# Patient Record
Sex: Male | Born: 1965
Health system: Southern US, Community
[De-identification: ages and names within clinical notes are randomized; demographics above are authoritative.]

## PROBLEM LIST (undated history)

## (undated) DIAGNOSIS — E538 Deficiency of other specified B group vitamins: Secondary | ICD-10-CM

## (undated) DIAGNOSIS — N529 Male erectile dysfunction, unspecified: Secondary | ICD-10-CM

## (undated) DIAGNOSIS — E785 Hyperlipidemia, unspecified: Secondary | ICD-10-CM

## (undated) DIAGNOSIS — F430 Acute stress reaction: Secondary | ICD-10-CM

## (undated) DIAGNOSIS — R9431 Abnormal electrocardiogram [ECG] [EKG]: Secondary | ICD-10-CM

## (undated) DIAGNOSIS — L309 Dermatitis, unspecified: Secondary | ICD-10-CM

## (undated) DIAGNOSIS — E038 Other specified hypothyroidism: Secondary | ICD-10-CM

## (undated) DIAGNOSIS — M25559 Pain in unspecified hip: Secondary | ICD-10-CM

## (undated) DIAGNOSIS — R7989 Other specified abnormal findings of blood chemistry: Secondary | ICD-10-CM

## (undated) HISTORY — DX: Acute stress reaction: F43.0

## (undated) HISTORY — DX: Other specified hypothyroidism: E03.8

## (undated) HISTORY — DX: Abnormal electrocardiogram (ECG) (EKG): R94.31

## (undated) HISTORY — DX: Deficiency of other specified B group vitamins: E53.8

## (undated) HISTORY — DX: Pain in unspecified hip: M25.559

## (undated) HISTORY — DX: Male erectile dysfunction, unspecified: N52.9

## (undated) HISTORY — DX: Hyperlipidemia, unspecified: E78.5

## (undated) HISTORY — DX: Other specified abnormal findings of blood chemistry: R79.89

## (undated) HISTORY — DX: Dermatitis, unspecified: L30.9

---

## 1991-09-11 HISTORY — PX: LASIK: SHX215

## 1996-09-10 HISTORY — PX: HAND SURGERY: SHX662

## 1998-06-15 ENCOUNTER — Encounter: Admission: RE | Admit: 1998-06-15 | Discharge: 1998-09-13 | Payer: Self-pay

## 1998-08-02 ENCOUNTER — Ambulatory Visit (HOSPITAL_COMMUNITY): Admission: RE | Admit: 1998-08-02 | Discharge: 1998-08-02 | Payer: Self-pay | Admitting: Orthopedic Surgery

## 1998-08-02 ENCOUNTER — Encounter: Payer: Self-pay | Admitting: Orthopedic Surgery

## 2002-08-27 ENCOUNTER — Encounter: Payer: Self-pay | Admitting: Family Medicine

## 2002-08-27 ENCOUNTER — Encounter: Admission: RE | Admit: 2002-08-27 | Discharge: 2002-08-27 | Payer: Self-pay | Admitting: Family Medicine

## 2005-03-02 ENCOUNTER — Ambulatory Visit: Payer: Self-pay | Admitting: Family Medicine

## 2005-05-09 ENCOUNTER — Ambulatory Visit: Payer: Self-pay | Admitting: Family Medicine

## 2006-10-11 HISTORY — PX: VASECTOMY: SHX75

## 2006-10-29 ENCOUNTER — Ambulatory Visit: Payer: Self-pay | Admitting: Family Medicine

## 2006-10-29 LAB — CONVERTED CEMR LAB
ALT: 18 units/L (ref 0–40)
AST: 21 units/L (ref 0–37)
Albumin: 3.9 g/dL (ref 3.5–5.2)
Alkaline Phosphatase: 61 units/L (ref 39–117)
BUN: 11 mg/dL (ref 6–23)
Basophils Absolute: 0 10*3/uL (ref 0.0–0.1)
Basophils Relative: 0.1 % (ref 0.0–1.0)
Bilirubin, Direct: 0.1 mg/dL (ref 0.0–0.3)
CO2: 30 meq/L (ref 19–32)
Calcium: 9.3 mg/dL (ref 8.4–10.5)
Chloride: 102 meq/L (ref 96–112)
Cholesterol: 139 mg/dL (ref 0–200)
Creatinine, Ser: 1 mg/dL (ref 0.4–1.5)
Eosinophils Absolute: 0 10*3/uL (ref 0.0–0.6)
Eosinophils Relative: 0.7 % (ref 0.0–5.0)
GFR calc Af Amer: 108 mL/min
GFR calc non Af Amer: 89 mL/min
Glucose, Bld: 96 mg/dL (ref 70–99)
HCT: 42.9 % (ref 39.0–52.0)
HDL: 40.7 mg/dL (ref >39.0)
Hemoglobin: 14.7 g/dL (ref 13.0–17.0)
LDL Cholesterol: 84 mg/dL (ref 0–99)
Lymphocytes Relative: 29.9 % (ref 12.0–46.0)
MCHC: 34.3 g/dL (ref 30.0–36.0)
MCV: 91.6 fL (ref 78.0–100.0)
Monocytes Absolute: 0.4 10*3/uL (ref 0.2–0.7)
Monocytes Relative: 9.2 % (ref 3.0–11.0)
Neutro Abs: 2.8 10*3/uL (ref 1.4–7.7)
Neutrophils Relative %: 60.1 % (ref 43.0–77.0)
Platelets: 226 10*3/uL (ref 150–400)
Potassium: 3.9 meq/L (ref 3.5–5.1)
RBC: 4.68 M/uL (ref 4.22–5.81)
RDW: 12 % (ref 11.5–14.6)
Sodium: 139 meq/L (ref 135–145)
TSH: 6.03 microintl units/mL — ABNORMAL HIGH (ref 0.35–5.50)
Total Bilirubin: 0.7 mg/dL (ref 0.3–1.2)
Total CHOL/HDL Ratio: 3.4
Total Protein: 6.5 g/dL (ref 6.0–8.3)
Triglycerides: 73 mg/dL (ref 0–149)
VLDL: 15 mg/dL (ref 0–40)
WBC: 4.6 10*3/uL (ref 4.5–10.5)

## 2006-11-27 ENCOUNTER — Ambulatory Visit: Payer: Self-pay | Admitting: Family Medicine

## 2006-11-27 LAB — CONVERTED CEMR LAB: TSH: 5.71 microintl units/mL — ABNORMAL HIGH (ref 0.35–5.50)

## 2007-03-10 ENCOUNTER — Ambulatory Visit: Payer: Self-pay | Admitting: Family Medicine

## 2007-03-10 DIAGNOSIS — E039 Hypothyroidism, unspecified: Secondary | ICD-10-CM | POA: Insufficient documentation

## 2007-03-11 LAB — CONVERTED CEMR LAB
Free T4: 0.9 ng/dL (ref 0.6–1.6)
TSH: 5.17 microintl units/mL (ref 0.35–5.50)

## 2008-03-10 ENCOUNTER — Ambulatory Visit: Payer: Self-pay | Admitting: Family Medicine

## 2008-03-10 ENCOUNTER — Encounter (INDEPENDENT_AMBULATORY_CARE_PROVIDER_SITE_OTHER): Payer: Self-pay | Admitting: Internal Medicine

## 2008-03-10 LAB — CONVERTED CEMR LAB
Chloride: 106 meq/L (ref 96–112)
GFR calc Af Amer: 95 mL/min
GFR calc non Af Amer: 79 mL/min
Potassium: 4.2 meq/L (ref 3.5–5.1)
T4, Total: 8.1 ug/dL (ref 5.0–12.5)
TSH: 4.06 microintl units/mL (ref 0.35–5.50)

## 2008-03-18 ENCOUNTER — Encounter: Admission: RE | Admit: 2008-03-18 | Discharge: 2008-03-18 | Payer: Self-pay | Admitting: Family Medicine

## 2008-08-06 ENCOUNTER — Ambulatory Visit: Payer: Self-pay | Admitting: Family Medicine

## 2008-08-06 LAB — CONVERTED CEMR LAB: Rapid Strep: NEGATIVE

## 2009-03-03 ENCOUNTER — Telehealth (INDEPENDENT_AMBULATORY_CARE_PROVIDER_SITE_OTHER): Payer: Self-pay | Admitting: Internal Medicine

## 2009-05-10 ENCOUNTER — Ambulatory Visit: Payer: Self-pay | Admitting: Family Medicine

## 2009-05-10 DIAGNOSIS — E079 Disorder of thyroid, unspecified: Secondary | ICD-10-CM | POA: Insufficient documentation

## 2009-05-12 LAB — CONVERTED CEMR LAB
ALT: 38 units/L (ref 0–53)
AST: 34 units/L (ref 0–37)
Albumin: 4.2 g/dL (ref 3.5–5.2)
Alkaline Phosphatase: 60 units/L (ref 39–117)
Basophils Absolute: 0 10*3/uL (ref 0.0–0.1)
Basophils Relative: 0 % (ref 0.0–3.0)
CO2: 30 meq/L (ref 19–32)
Calcium: 9.2 mg/dL (ref 8.4–10.5)
GFR calc non Af Amer: 87.34 mL/min (ref >60)
Glucose, Bld: 100 mg/dL — ABNORMAL HIGH (ref 70–99)
HCT: 40 % (ref 39.0–52.0)
Hemoglobin: 13.4 g/dL (ref 13.0–17.0)
Lymphocytes Relative: 18.5 % (ref 12.0–46.0)
Lymphs Abs: 1.1 10*3/uL (ref 0.7–4.0)
Monocytes Relative: 5.7 % (ref 3.0–12.0)
Neutro Abs: 4.4 10*3/uL (ref 1.4–7.7)
Potassium: 4.1 meq/L (ref 3.5–5.1)
RBC: 4.31 M/uL (ref 4.22–5.81)
RDW: 12 % (ref 11.5–14.6)
Sodium: 141 meq/L (ref 135–145)
TSH: 3.86 microintl units/mL (ref 0.35–5.50)
Total CHOL/HDL Ratio: 3
Total Protein: 6.6 g/dL (ref 6.0–8.3)
VLDL: 7 mg/dL (ref 0.0–40.0)

## 2009-09-13 ENCOUNTER — Ambulatory Visit: Payer: Self-pay | Admitting: Family Medicine

## 2009-10-11 HISTORY — PX: TOTAL HIP ARTHROPLASTY: SHX124

## 2009-10-20 ENCOUNTER — Encounter: Payer: Self-pay | Admitting: Family Medicine

## 2009-11-08 ENCOUNTER — Encounter: Payer: Self-pay | Admitting: Family Medicine

## 2009-11-11 ENCOUNTER — Encounter: Payer: Self-pay | Admitting: Family Medicine

## 2009-11-12 ENCOUNTER — Encounter: Payer: Self-pay | Admitting: Family Medicine

## 2009-11-12 ENCOUNTER — Observation Stay (HOSPITAL_COMMUNITY): Admission: EM | Admit: 2009-11-12 | Discharge: 2009-11-13 | Payer: Self-pay | Admitting: Emergency Medicine

## 2009-11-13 ENCOUNTER — Encounter: Payer: Self-pay | Admitting: Family Medicine

## 2009-11-16 ENCOUNTER — Ambulatory Visit: Payer: Self-pay | Admitting: Family Medicine

## 2009-11-16 DIAGNOSIS — R7401 Elevation of levels of liver transaminase levels: Secondary | ICD-10-CM | POA: Insufficient documentation

## 2009-11-16 DIAGNOSIS — E538 Deficiency of other specified B group vitamins: Secondary | ICD-10-CM

## 2009-11-16 DIAGNOSIS — D509 Iron deficiency anemia, unspecified: Secondary | ICD-10-CM

## 2009-11-16 DIAGNOSIS — R740 Nonspecific elevation of levels of transaminase and lactic acid dehydrogenase [LDH]: Secondary | ICD-10-CM

## 2009-11-17 ENCOUNTER — Telehealth: Payer: Self-pay | Admitting: Family Medicine

## 2009-11-17 ENCOUNTER — Ambulatory Visit: Payer: Self-pay | Admitting: Family Medicine

## 2009-11-17 LAB — CONVERTED CEMR LAB
AST: 89 units/L — ABNORMAL HIGH (ref 0–37)
BUN: 9 mg/dL (ref 6–23)
Basophils Absolute: 0 10*3/uL (ref 0.0–0.1)
Basophils Relative: 0.1 % (ref 0.0–3.0)
Bilirubin, Direct: 0.2 mg/dL (ref 0.0–0.3)
Chloride: 103 meq/L (ref 96–112)
Eosinophils Absolute: 0 10*3/uL (ref 0.0–0.7)
GFR calc non Af Amer: 131.48 mL/min (ref >60)
Lymphocytes Relative: 10.9 % — ABNORMAL LOW (ref 12.0–46.0)
MCHC: 33.4 g/dL (ref 30.0–36.0)
MCV: 91.8 fL (ref 78.0–100.0)
Monocytes Absolute: 1 10*3/uL (ref 0.1–1.0)
Neutrophils Relative %: 77.4 % — ABNORMAL HIGH (ref 43.0–77.0)
Platelets: 65 10*3/uL — ABNORMAL LOW (ref 150.0–400.0)
Potassium: 4.3 meq/L (ref 3.5–5.1)
RDW: 11.7 % (ref 11.5–14.6)
Sodium: 139 meq/L (ref 135–145)
Total Bilirubin: 0.5 mg/dL (ref 0.3–1.2)

## 2009-11-18 ENCOUNTER — Encounter: Payer: Self-pay | Admitting: Family Medicine

## 2009-11-18 ENCOUNTER — Emergency Department (HOSPITAL_COMMUNITY): Admission: EM | Admit: 2009-11-18 | Discharge: 2009-11-18 | Payer: Self-pay | Admitting: Emergency Medicine

## 2009-11-18 LAB — CONVERTED CEMR LAB
ALT: 161 units/L — ABNORMAL HIGH (ref 0–53)
AST: 113 units/L — ABNORMAL HIGH (ref 0–37)
Basophils Relative: 0.4 % (ref 0.0–3.0)
HCT: 31.8 % — ABNORMAL LOW (ref 39.0–52.0)
Hemoglobin: 10.6 g/dL — ABNORMAL LOW (ref 13.0–17.0)
Lymphocytes Relative: 5.5 % — ABNORMAL LOW (ref 12.0–46.0)
Lymphs Abs: 0.7 10*3/uL (ref 0.7–4.0)
MCHC: 33.2 g/dL (ref 30.0–36.0)
Monocytes Relative: 7.6 % (ref 3.0–12.0)
Neutro Abs: 11.1 10*3/uL — ABNORMAL HIGH (ref 1.4–7.7)
RBC: 3.43 M/uL — ABNORMAL LOW (ref 4.22–5.81)

## 2009-11-24 ENCOUNTER — Telehealth: Payer: Self-pay | Admitting: Family Medicine

## 2009-11-28 ENCOUNTER — Telehealth: Payer: Self-pay | Admitting: Family Medicine

## 2009-12-13 ENCOUNTER — Ambulatory Visit: Payer: Self-pay | Admitting: Family Medicine

## 2009-12-13 DIAGNOSIS — M79609 Pain in unspecified limb: Secondary | ICD-10-CM | POA: Insufficient documentation

## 2009-12-15 LAB — CONVERTED CEMR LAB
Basophils Absolute: 0 10*3/uL (ref 0.0–0.1)
Eosinophils Absolute: 0.2 10*3/uL (ref 0.0–0.7)
HCT: 34.8 % — ABNORMAL LOW (ref 39.0–52.0)
Hemoglobin: 12.1 g/dL — ABNORMAL LOW (ref 13.0–17.0)
Lymphs Abs: 1.3 10*3/uL (ref 0.7–4.0)
MCHC: 34.7 g/dL (ref 30.0–36.0)
Monocytes Absolute: 0.4 10*3/uL (ref 0.1–1.0)
Neutro Abs: 2.7 10*3/uL (ref 1.4–7.7)
RDW: 14 % (ref 11.5–14.6)

## 2010-06-17 IMAGING — CT CT ABD-PELV W/ CM
2 of 5 series · 17 of 46 positions shown, 19 images · IV contrast (APPLIED)
Comparison: None

CLINICAL DATA: Abdominal pain, constipation, low back pain, nausea,
vomiting

CT ABDOMEN AND PELVIS WITH CONTRAST
TECHNIQUE: Multidetector CT imaging of the abdomen and pelvis was
performed following the standard protocol during bolus
administration of intravenous contrast. Breast shield utilized.
Sagittal and coronal MPR images reconstructed from axial data set.
Contrast: Dilute oral contrast and 125 ml 0mnipaque-B00 IV

[Series 2: abd_pel 5.0 b40f st · axial · 0.69mm/px · z∈[-426,-41]mm · 14 of 87 slices shown, 16 images]
[im 5/87  soft-tissue]
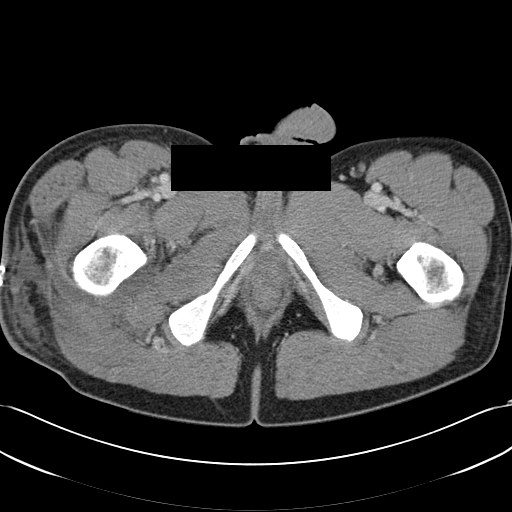
[im 5/87  bone]
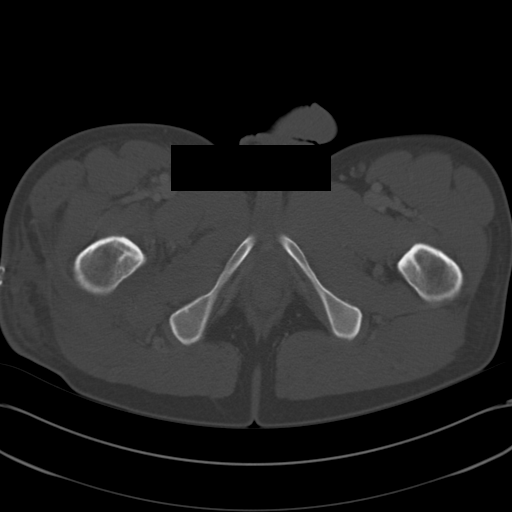
[im 10/87  soft-tissue]
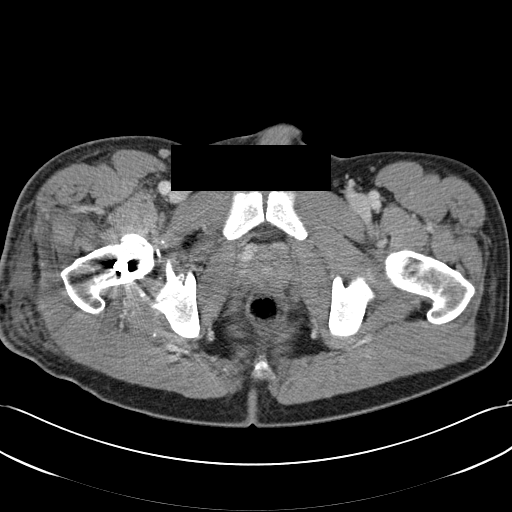
[im 19/87  soft-tissue]
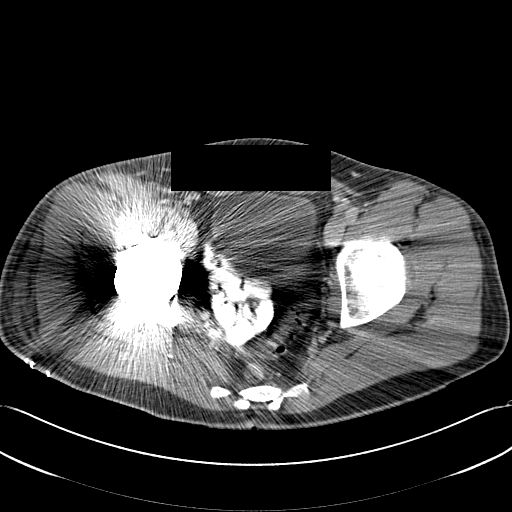
[im 23/87  soft-tissue]
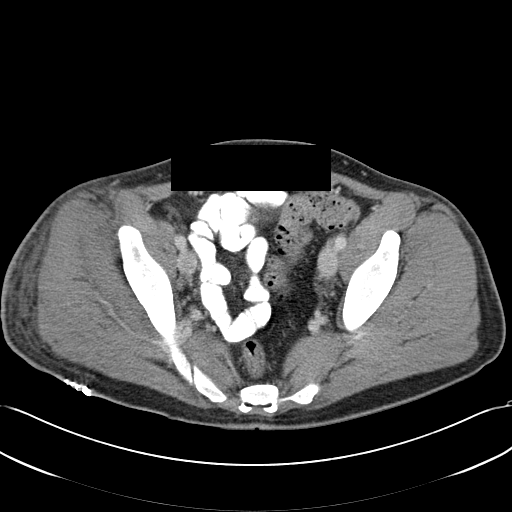
[im 28/87  soft-tissue]
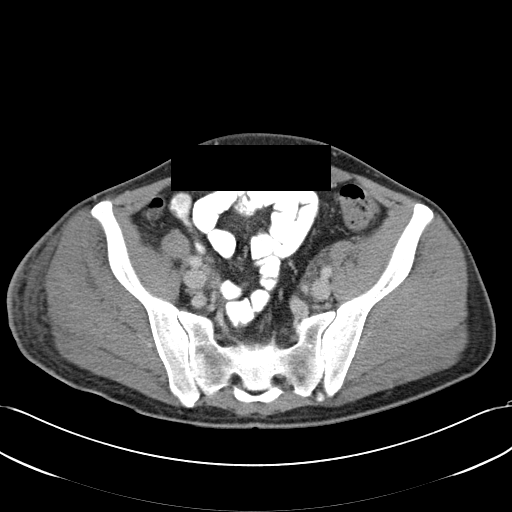
[im 37/87  soft-tissue]
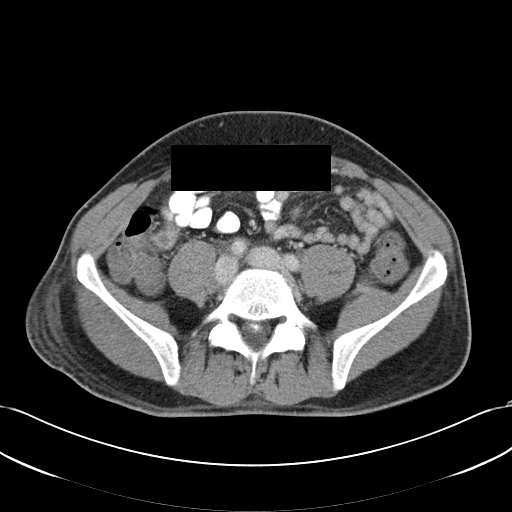
[im 41/87  soft-tissue]
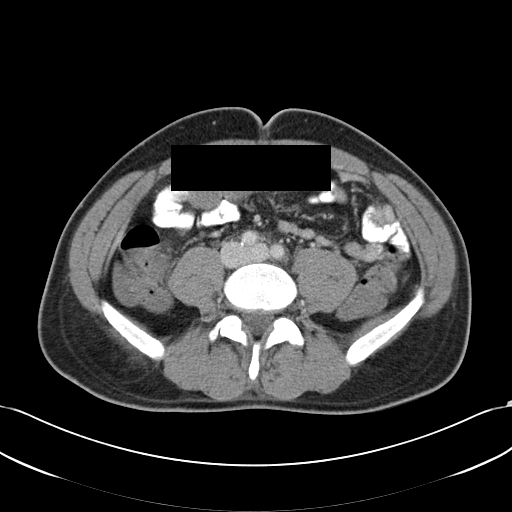
[im 46/87  soft-tissue]
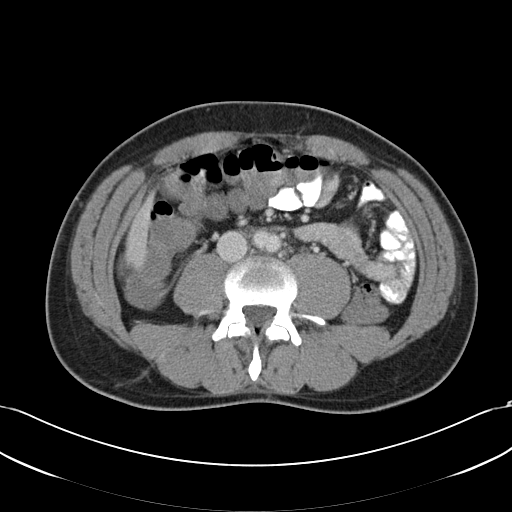
[im 50/87  soft-tissue]
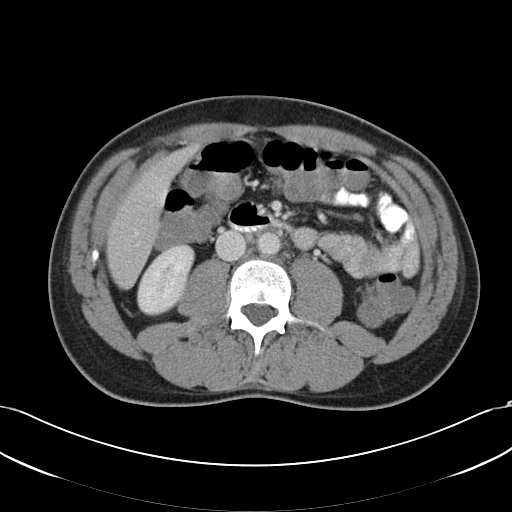
[im 50/87  bone]
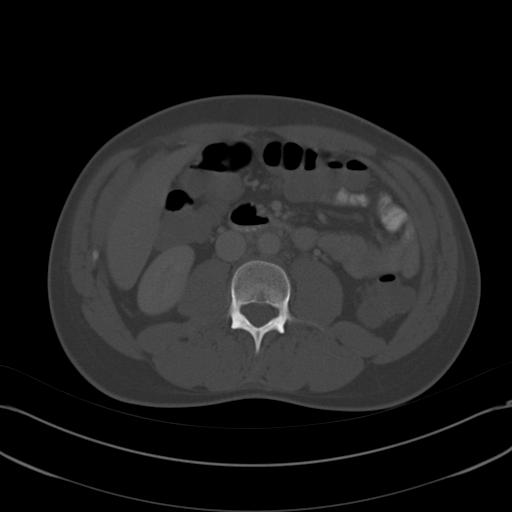
[im 59/87  soft-tissue]
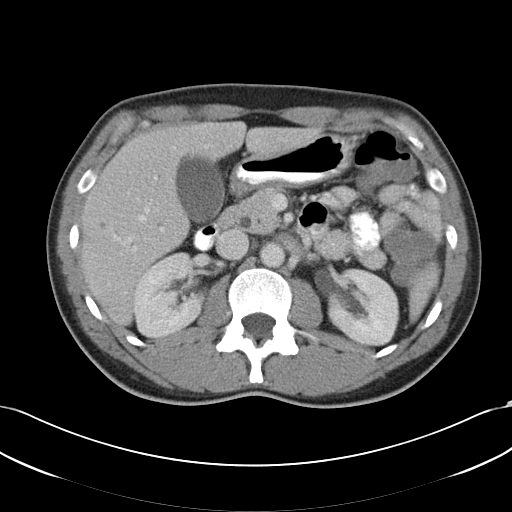
[im 64/87  soft-tissue]
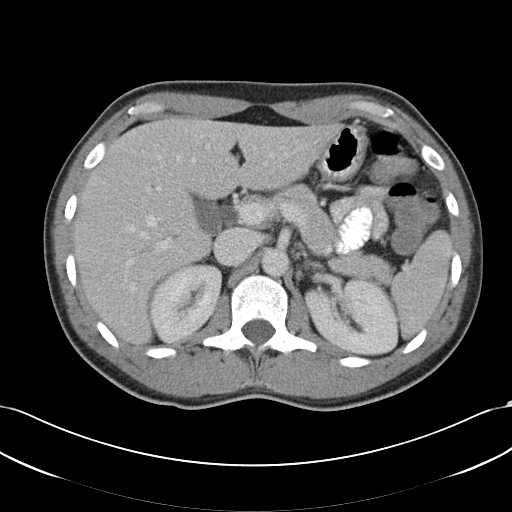
[im 68/87  soft-tissue]
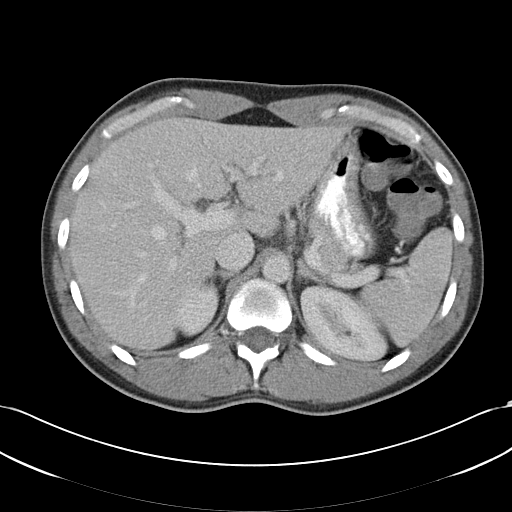
[im 77/87  soft-tissue]
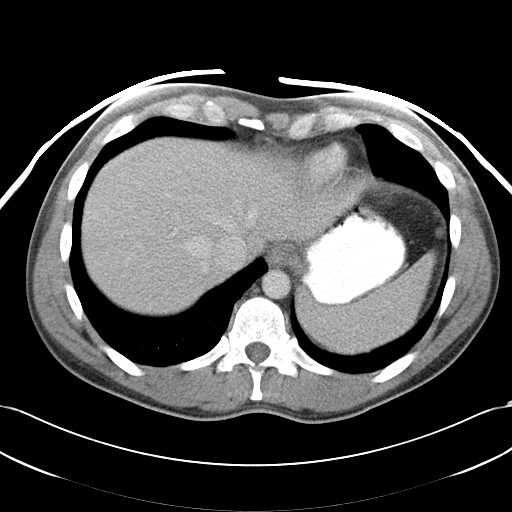
[im 82/87  soft-tissue]
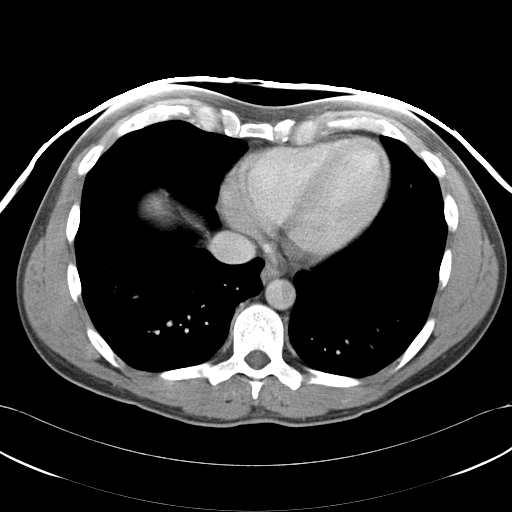

[Series 602: coronal abddomen · coronal · 0.88mm/px · 3 of 114 slices shown]
[im 38/114  soft-tissue]
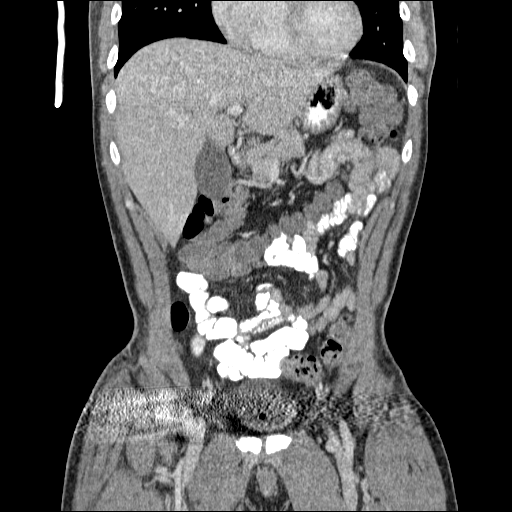
[im 51/114  soft-tissue]
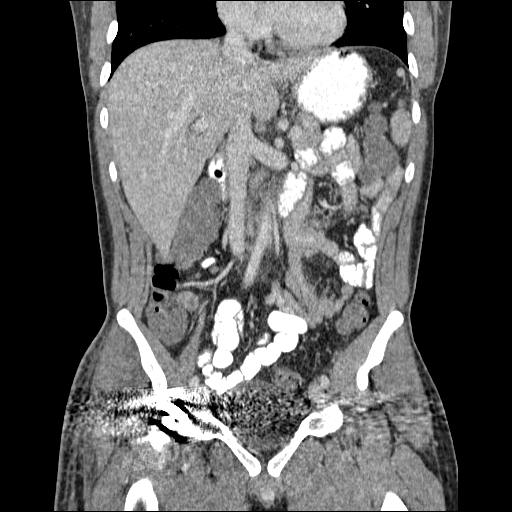
[im 63/114  soft-tissue]
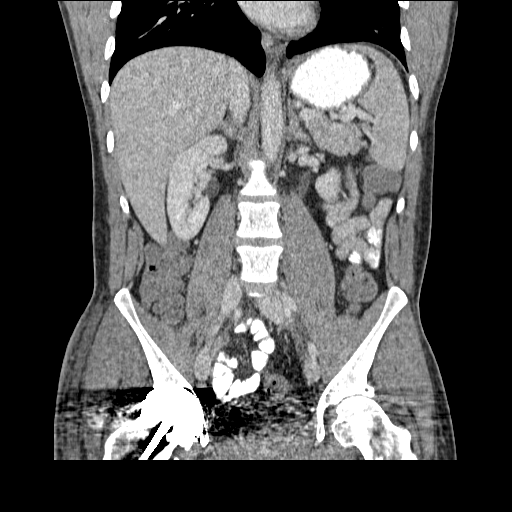

[17 of 46 positions shown; findings below may reference images not displayed]

FINDINGS: Minimal atelectasis left lung base.
Liver, spleen, pancreas, kidneys, and adrenal glands normal
appearance.
Stomach and bowel loops unremarkable.
Normal appendix.
Beam hardening artifacts from right hip prosthesis obscure portions
of pelvis.
Minimal hazy infiltrative changes are seen within periaortic fat,
nonspecific.
Scattered normal-sized lymph nodes.
No mass, adenopathy, ascites or definite acute inflammatory
process.
IMPRESSION: Minimal nonspecific para-aortic stranding, of questionable clinical
significance.
No other intra abdominal abnormalities are identified.

## 2010-07-28 ENCOUNTER — Ambulatory Visit: Payer: Self-pay | Admitting: Family Medicine

## 2010-07-28 DIAGNOSIS — J069 Acute upper respiratory infection, unspecified: Secondary | ICD-10-CM | POA: Insufficient documentation

## 2010-08-08 ENCOUNTER — Ambulatory Visit: Payer: Self-pay | Admitting: Family Medicine

## 2010-10-12 NOTE — Letter (Signed)
Summary: Meds Appts & Instructions/[facility]  Meds Appts & Instructions/[facility]   Imported By: Lanelle Bal 11/16/2009 12:35:21  _____________________________________________________________________  External Attachment:    Type:   Image     Comment:   External Document

## 2010-10-12 NOTE — Assessment & Plan Note (Signed)
Summary: 2 week follow up - recheck labs /lsf   Vital Signs:  Patient profile:   45 year old male Height:      67 inches Weight:      146.25 pounds BMI:     22.99 Temp:     98 degrees F rectal Pulse rate:   80 / minute Pulse rhythm:   regular BP sitting:   110 / 58  (left arm) Cuff size:   regular  Vitals Entered By: Lewanda Rife LPN (December 13, 452 9:12 AM) CC: 2 week f/u and recheck labs   History of Present Illness: here for f/u of anemia after hip repl   is 100 % better   last visit - malaise and then was sent to hosp with low platelet- that did correct itself   diarrhea -- is better / nl c diff - that is better   last cbc platelet 68, HB in 10s and elevated wbc  thinks the low platelet was from lovenox -- corrected after changing from    on nu iron 150 once daily -- thinks that has boosted it back up   hip is stiff and sore - walking without a cane   spot on bottom of L foot is sore and a knot- now is much better  still wearing ted hose   Allergies (verified): No Known Drug Allergies  Past History:  Past Medical History: Last updated: 06-05-09 increased tsh  stress reaction  hip pain   Past Surgical History: Last updated: 11/16/2009 vesectomy  2/08 plate nd screws in 3rd finger of R hand follow ing fx--19998 ALK--ou 1993 hip replacement 2/11   Family History: Last updated: 06/05/09 father HTN , and died of MI 49  mother - hypothyroid  DM in grandparent PGM  MGF with DM   Social History: Last updated: June 05, 2009 Marital Status: Married Children: 2 Occupation: Nurse, adult for [employer] Never Smoked 1-2 drinks per month maximum  Risk Factors: Alcohol Use: <1 (03/10/2008) Caffeine Use: 3 (03/10/2008) Exercise: yes (03/10/2008)  Risk Factors: Smoking Status: never (08/06/2008) Passive Smoke Exposure: no (03/10/2008)  Review of Systems General:  Denies fatigue, fever, loss of appetite, and malaise. Eyes:  Denies blurring and eye  irritation. CV:  Denies chest pain or discomfort, lightheadness, and palpitations. Resp:  Denies cough and wheezing. GI:  Denies abdominal pain, bloody stools, change in bowel habits, indigestion, nausea, and vomiting; wants to go ahead and stop omeprazole since stomach feels fine. GU:  Denies hematuria. MS:  Complains of joint pain and stiffness; denies joint redness and joint swelling. Derm:  Denies lesion(s), poor wound healing, and rash. Neuro:  Denies numbness, tingling, and weakness. Psych:  mood is good . Heme:  Denies abnormal bruising, bleeding, and enlarge lymph nodes.  Physical Exam  General:  much improved - energetic with good color  Head:  normocephalic, atraumatic, and no abnormalities observed.   Eyes:  vision grossly intact, pupils equal, pupils round, and pupils reactive to light.  no conj pallor  Mouth:  pharynx pink and moist.   Neck:  supple with full rom and no masses or thyromegally, no JVD or carotid bruit  Lungs:  Normal respiratory effort, chest expands symmetrically. Lungs are clear to auscultation, no crackles or wheezes. Heart:  Normal rate and regular rhythm. S1 and S2 normal without gallop, murmur, click, rub or other extra sounds. Abdomen:  Bowel sounds positive,abdomen soft and non-tender without masses, organomegaly or hernias noted. Msk:  1/2 cm knot on bottom  of L foot in arch- rubbery and slt tender wearing ted hose improved rom of hip Pulses:  R and L carotid,radial,femoral,dorsalis pedis and posterior tibial pulses are full and equal bilaterally Extremities:  no CCE Neurologic:  sensation intact to light touch, gait normal, and DTRs symmetrical and normal.   Skin:  no ecchymosis  no pallor or jaundice  Psych:  normal affect, talkative and pleasant    Impression & Recommendations:  Problem # 1:  ANEMIA, IRON DEFICIENCY (ICD-280.9) Assessment Unchanged  after hip surgery on iron  re check today and adv  also check on platelet and wbc -  suspect will be normalized  His updated medication list for this problem includes:    Vitamin B-12 1000 Mcg Tabs (Cyanocobalamin) .Marland Kitchen... Take one tablet daily    Nu-iron 150 Mg Caps (Polysaccharide iron complex) .Marland Kitchen... 1 by mouth once daily with food  Orders: Venipuncture ([address]) TLB-CBC Platelet - w/Differential (85025-CBCD)  Hgb: 10.6 (11/17/2009)   Hct: 31.8 (11/17/2009)   Platelets: 68.0 (11/17/2009) RBC: 3.43 (11/17/2009)   RDW: 11.8 (11/17/2009)   WBC: 12.9 (11/17/2009) MCV: 92.6 (11/17/2009)   MCHC: 33.2 (11/17/2009) TSH: 3.86 (05/10/2009)  Problem # 2:  FOOT PAIN, LEFT (ICD-729.5) Assessment: New with knot on bottom of foot -- overall now improved ? ca dep on tendon  pt will f/u with ortho for this - wear supportive shoe  Complete Medication List: 1)  Miralax Pack (Polyethylene glycol 3350) .Marland KitchenMarland Kitchen. 17 gms by mouth daily as needed. 2)  Tylenol Extra Strength 500 Mg Tabs (Acetaminophen) .... Otc as directed. 3)  Vitamin C 500 Mg Tabs (Ascorbic acid) .... One daily 4)  Vitamin B-12 1000 Mcg Tabs (Cyanocobalamin) .... Take one tablet daily 5)  Nu-iron 150 Mg Caps (Polysaccharide iron complex) .Marland Kitchen.. 1 by mouth once daily with food 6)  Aspirin 325 Mg Tabs (Aspirin) .... Take 1 tablet by mouth once a day  Patient Instructions: 1)  labs today 2)  continue your iron until I tell you to stop  3)  if spot on foot grows or hurts more - let me know , and show to your orthopedist at follow up   Current Allergies (reviewed today): No known allergies

## 2010-10-12 NOTE — Assessment & Plan Note (Signed)
Summary: FEVER AND COUGH/EVM   Vital Signs:  Patient Profile:   45 Years Old Male CC:      Cold & URI symptoms Height:     67.5 inches (170.18 cm) Weight:      159 pounds BMI:     24.62 O2 Sat:      97 % O2 treatment:    Room Air Temp:     97.8 degrees F oral Pulse rate:   82 / minute Pulse rhythm:   regular Resp:     18 per minute BP sitting:   129 / 65  (right arm)  Pt. in pain?   yes    Location:   chest    Intensity:   2    Type:       aching  Vitals Entered By: Levonne Spiller EMT-P (July 28, 2010 2:48 PM)              Is Patient Diabetic? No      Current Allergies: No known allergies History of Present Illness Chief Complaint: Cold & URI symptoms  REVIEW OF SYSTEMS Constitutional Symptoms       Complains of fever and chills.     Denies night sweats, weight loss, weight gain, and fatigue.  Eyes       Denies change in vision, eye pain, eye discharge, glasses, contact lenses, and eye surgery. Ear/Nose/Throat/Mouth       Complains of sinus problems and hoarseness.      Denies hearing loss/aids, change in hearing, ear pain, ear discharge, dizziness, frequent runny nose, frequent nose bleeds, sore throat, and tooth pain or bleeding.  Respiratory       Complains of productive cough.      Denies dry cough, wheezing, shortness of breath, asthma, bronchitis, and emphysema/COPD.      Comments: Light yellow colored sputum Cardiovascular       Denies murmurs, chest pain, and tires easily with exhertion.    Gastrointestinal       Denies stomach pain, nausea/vomiting, diarrhea, constipation, blood in bowel movements, and indigestion. Genitourniary       Denies painful urination, kidney stones, and loss of urinary control. Neurological       Denies paralysis, seizures, and fainting/blackouts. Musculoskeletal       Denies muscle pain, joint pain, joint stiffness, decreased range of motion, redness, swelling, muscle weakness, and gout.  Skin       Denies bruising,  unusual mles/lumps or sores, and hair/skin or nail changes.  Psych       Denies mood changes, temper/anger issues, anxiety/stress, speech problems, depression, and sleep problems.  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 1-2 drinks per month maximum  Risk Factors: Alcohol Use: <1 (03/10/2008) Caffeine Use: 3 (03/10/2008) Exercise: yes (03/10/2008)  Risk Factors: Smoking Status: never (08/06/2008) Passive Smoke Exposure: no (03/10/2008)  Past Surgical History: vesectomy  2/08 plate nd screws in 3rd finger of R hand follow ing fx--19998 ALK--ou 1993 hip replacement (Resurfacing)  2/11 Physical Exam General appearance: well developed, well nourished, no acute distress Head: normocephalic, atraumatic Eyes: conjunctivae and lids normal Pupils: equal, round, reactive to light Ears: normal, no lesions or  deformities Nasal: nasal congestion and edema Oral/Pharynx: dry mucous membranes Neck: neck supple,  trachea midline, no masses Chest/Lungs: no rales, wheezes, or rhonchi bilateral, breath sounds equal without effort Heart: regular rate and  rhythm, no murmur Abdomen: soft, non-tender without obvious organomegaly Extremities: normal extremities Neurological: grossly intact and non-focal Skin: no obvious rashes or lesions MSE: oriented to time, place, and person Assessment New Problems: COUGH (ICD-786.2) UPPER RESPIRATORY INFECTION, ACUTE (ICD-465.9)   Patient Education: Patient and/or caregiver instructed in the following: rest, fluids. The risks, benefits and possible side effects were clearly explained and discussed with the patient.  The  patient verbalized clear understanding.  The patient was given instructions to return if symptoms don't improve, worsen or new changes develop.  If it is not during clinic hours and the patient cannot get back to this clinic then the patient was told to seek medical care at an available urgent care or emergency department.  The patient verbalized understanding.   Demonstrates willingness to comply.  Plan New Medications/Changes: ZITHROMAX Z-PAK 250 MG TABS (AZITHROMYCIN) take as directed  #1 x 0, 07/28/2010, Clanford Johnson MD MYTUSSIN AC 100-10 MG/5ML SYRP (GUAIFENESIN-CODEINE) take 1 teaspoon by mouth every 4-6 hours as needed cough and congestion  #120 mL x 0, 07/28/2010, Clanford Johnson MD  Follow Up: Follow up on an as needed basis, Follow up with Primary Physician  The patient and/or caregiver has been counseled thoroughly with regard to medications prescribed including dosage, schedule, interactions, rationale for use, and possible side effects and they verbalize understanding.  Diagnoses and expected course of recovery discussed and will return if not improved as expected or if the condition worsens. Patient and/or caregiver verbalized understanding.  Prescriptions: ZITHROMAX Z-PAK 250 MG TABS (AZITHROMYCIN) take as directed  #1 x 0   Entered and Authorized by:   Standley Dakins MD   Signed by:   Standley Dakins MD on 07/28/2010   Method used:   Electronically to        [facility] (retail)       [address], [address]       [place], [place]  [address]       Ph: [phone]       Fax: [phone]   RxID:   [id number] MYTUSSIN AC 100-10 MG/5ML SYRP (GUAIFENESIN-CODEINE) take 1 teaspoon by mouth every 4-6 hours as needed cough and congestion  #120 mL x 0   Entered and Authorized by:   Standley Dakins MD   Signed by:   Standley Dakins MD on 07/28/2010   Method used:   Print then Give to Patient   RxID:    [id number]   Patient Instructions: 1)  The patient was informed that there is no on-call Jaylani Mcguinn or services available at this clinic during off-hours (when the clinic is closed).  If the patient developed a problem or concern that required immediate attention, the patient was advised to go the the nearest available urgent care or emergency department for medical care.  The patient verbalized understanding.    2)  Get plenty of rest, drink lots of clear liquids, and use Tylenol or Ibuprofen for fever and comfort. Return in 7-10 days if you're not better:sooner if you're feeling worse. 3)  I don't think that you need to take antibiotics at this time.  If you develop worsening productive cough, sinus pressure, drainage, fever, chills, or bad taste in mouth get the prescription for  the antibiotic Zithromax filled and take it as directed.

## 2010-10-12 NOTE — Progress Notes (Signed)
Summary: regarding prilosec  Phone Note Call from Patient Call back at Home Phone [phone]   Caller: Patient Call For: Judith Part MD Summary of Call: Pt was told to take 2 prilosec  a day at last office visit.  He is asking how long he is to continue this.  I told him to continue until you advise, he knows you are out till tuesday. Initial call taken by: Lowella Petties CMA,  November 24, 2009 10:09 AM  Follow-up for Phone Call        can switch now to once daily in am (if stomach is feeling better)-- continue that for 1 mo and then stop- but at any time update me if abd pain returns Follow-up by: Judith Part MD,  November 24, 2009 1:37 PM  Additional Follow-up for Phone Call Additional follow up Details #1::        Patient notified as instructed by telephone. Lewanda Rife LPN  November 24, 2009 2:17 PM

## 2010-10-12 NOTE — Letter (Signed)
Summary: *Referral Letter [facility])  Albert Lea at [facility]  [address] [place], [place] [address]   Phone: [phone]  Fax: [phone]    11/18/2009  Thank you in advance for agreeing to see my patient:  Warren Hatfield [address] [place], [place]  [address]  Phone: [phone]  Reason for Referral: Pt has fever, abdominal pain, low platelet, high wbc. Please have ER doctor evaluate. Pt was recently hospitalized.  Procedures Requested:   Current Medical Problems: 1)  TRANSAMINASES, SERUM, ELEVATED (ICD-790.4) 2)  VITAMIN B12 DEFICIENCY (ICD-266.2) 3)  ANEMIA, IRON DEFICIENCY (ICD-280.9) 4)  DIARRHEA (ICD-787.91) 5)  EPIGASTRIC PAIN (ICD-789.06) 6)  THYROID STIMULATING HORMONE, ABNORMAL (ICD-246.9) 7)  ISCHEMIC HEART DISEASE, PREMATURE, FAMILY HX (ICD-V17.3) 8)  HEALTH MAINTENANCE EXAM (ICD-V70.0) 9)  HYPOTHYROIDISM NOS (ICD-244.9)   Current Medications: 1)  TESSALON 200 MG CAPS (BENZONATATE) one tab by mouth three times a day as needed for cough 2)  MIRALAX  PACK (POLYETHYLENE GLYCOL 3350) 17 gms by mouth daily as needed. 3)  LOVENOX 40 MG/0.4ML SOLN (ENOXAPARIN SODIUM) 40mg  subcutaneously daily 4)  SENOKOT 8.6 MG TABS (SENNOSIDES) two tablets by mouth every am 5)  TYLENOL EXTRA STRENGTH 500 MG TABS (ACETAMINOPHEN) OTC As directed. 6)  * OXYCODONE/ACETAMINOPHEN ?MG One - three tablets by mouth every 4 hrs as needed. 7)  VITAMIN C 500 MG  TABS (ASCORBIC ACID) one daily 8)  VITAMIN B-12 1000 MCG TABS (CYANOCOBALAMIN) take one tablet daily 9)  PRILOSEC 20 MG CPDR (OMEPRAZOLE) 1 by mouth two times a day 10)  NU-IRON 150 MG CAPS (POLYSACCHARIDE IRON COMPLEX) 1 by mouth once daily with food   Past Medical History: 1)  increased tsh  2)  stress reaction  3)  hip pain    Family History: 1)  father HTN , and died of MI 90  2)  mother - hypothyroid  3)  DM in grandparent PGM  4)  MGF with DM     Social History: 1)  Marital Status: Married 2)   Children: 2 3)  Occupation: Nurse, adult for [employer] 4)  Never Smoked 5)  1-2 drinks per month maximum   Pertinent Labs:    Please send (mail / fax) all correspondence pertaining to this referral to:  ATTN:    _________________________________   [facility]   [address]   [place], [place] [address]    Fax: [phone]  Thank you again for agreeing to see our patient; please contact us if you have any further questions or need additional information.  Sincerely,  Idamae Schuller Tower,MD  Appended Document: *Referral Letter [facility]) faxed to [facility] Triage [phone] as instructed.

## 2010-10-12 NOTE — Letter (Signed)
Summary: [facility]  [facility]   Imported By: Beau Fanny 11/21/2009 15:31:15  _____________________________________________________________________  External Attachment:    Type:   Image     Comment:   External Document

## 2010-10-12 NOTE — Progress Notes (Signed)
Summary: wants to know if he needs to be seen   Phone Note Call from Patient Call back at Home Phone [phone]   Caller: Patient Call For: Judith Part MD Summary of Call: Patient wants to know if he needs to continue taking the iron or if he needs to be rechecked. He says that he was never told any instruction as to what to do and when to be seen again.  Initial call taken by: Melody Comas,  November 28, 2009 9:38 AM  Follow-up for Phone Call        continue iron as ordered to build up blood count  f/u with me in 2-4 weeks and will also check lab at that time Follow-up by: Judith Part MD,  November 28, 2009 11:12 PM  Additional Follow-up for Phone Call Additional follow up Details #1::        Patient Advised. Appointment scheduled  12/13/2009 Additional Follow-up by: Delilah Shan CMA (AAMA),  November 29, 2009 2:22 PM

## 2010-10-12 NOTE — Progress Notes (Signed)
Summary: Tingling in finger tips  Phone Note Call from Patient Call back at Home Phone [phone]   Caller: Mom/Sheila Call For: Judith Part MD Summary of Call: Patient's mom called stated that her son just left our office from having blood work.  Now he is complaining of tingling in his fingers and that the tips of his fingers are turning red.  The redness goes away after a few minutes and he was concerned.  Thinks it may be due to his platelets being low.  He is having no tingling in his arms or legs, no SOB, no wheezing.  He says that he cannot tell a change in anything else.  Please advise. Initial call taken by: Linde Gillis CMA Duncan Dull),  November 17, 2009 3:25 PM  Follow-up for Phone Call        Tingling should resolve and is typical of anxious moments like blood draws. If continues after today, call back for appt to be seen. Follow-up by: Shaune Leeks MD,  November 17, 2009 4:55 PM  Additional Follow-up for Phone Call Additional follow up Details #1::        Patient advised as instructed Additional Follow-up by: Linde Gillis CMA Duncan Dull),  November 17, 2009 5:02 PM

## 2010-10-12 NOTE — Letter (Signed)
Summary: [facility]  [facility]   Imported By: Lanelle Bal 11/23/2009 13:27:37  _____________________________________________________________________  External Attachment:    Type:   Image     Comment:   External Document

## 2010-10-12 NOTE — Progress Notes (Signed)
Summary: pt is a little better  Phone Note Call from Patient Call back at [facility] Phone [phone]   Caller: Patient Call For: Judith Part MD Summary of Call: Advised pt of lab results, he said he is feeling a little better than when he came in yesterday.  He is asking if he should be taking anything for his abd pain, besides prilosec- maybe something to coat his stomach, like MOM/ Initial call taken by: Lowella Petties CMA,  November 17, 2009 10:22 AM  Follow-up for Phone Call        he can do some maalox or mylanta and continue the prilosec- hopefully it will help more  I am so glad he is having some improvement I am re- assured by the hb coming up  I am a bit worried about low platelet count -- though this could be from his recent surgery I am cautious about the liver fxn -- suspect this is from tylenol , however  if his hip does not hurt too much- I do want him to take as little tylenol as possible at this point  I need to check the platelets again soon -- either late this afternoon or tom am --- and if he notices easy bruising or bleeding call asap  please sched labs - ast/alt and cbc with diff  Follow-up by: Judith Part MD,  November 17, 2009 10:31 AM  Additional Follow-up for Phone Call Additional follow up Details #1::        Patient notified as instructed by telephone. lab appointment scheduled as instructed  11/17/09 at 2:30pm.Rena [facility] LPN  November 17, 2009 11:07 AM

## 2010-10-12 NOTE — Assessment & Plan Note (Signed)
Summary: HOSPITAL F/U [place] NEEDS CBC/DLO   Vital Signs:  Patient profile:   45 year old male Height:      67 inches Weight:      149.25 pounds BMI:     23.46 Temp:     98.9 degrees F oral Pulse rate:   84 / minute Pulse rhythm:   regular BP sitting:   124 / 70  (left arm) Cuff size:   regular  Vitals Entered By: Lewanda Rife LPN (November 16, 452 12:36 PM) CC: f/u [facility] needs CBC   History of Present Illness: pt is here for hosp f/u had recent hip repl and then was hosp at [facility] for several other problems  had acute abd pain from constipation from narcotics as well as anemia from surg (need to re check) also dehydration  was found to have hb of 8.5 and also low B12 at 238  is on iron and B12 and needs re check  was tx with miralax for constip  is still feeling really bad -- but is able to have bm - which is now loose no longer taking any laxatives / etc   the hip is getting better   still having a lot of abd pain and pressure -- is all epigastric now - dull and persistant no heartburn no urinary symptoms  was on toradol in the hospital  no longer on mobic   is only taking tylenol now -- has not needed narcotics   no n/v  just pain in mid abd as if it was in a spasm   is still having some fever -- low grade -- every day since his surgery  CT and abd x ray were nl   goes back to hip surgeon on 16th  wound is looking good   Allergies (verified): No Known Drug Allergies  Past History:  Past Medical History: Last updated: 06-Jun-2009 increased tsh  stress reaction  hip pain   Family History: Last updated: 06-06-2009 father HTN , and died of MI 90  mother - hypothyroid  DM in grandparent PGM  MGF with DM   Social History: Last updated: June 06, 2009 Marital Status: Married Children: 2 Occupation: Nurse, adult for [employer] Never Smoked 1-2 drinks per month maximum  Risk Factors: Alcohol Use: <1 (03/10/2008) Caffeine Use: 3  (03/10/2008) Exercise: yes (03/10/2008)  Risk Factors: Smoking Status: never (08/06/2008) Passive Smoke Exposure: no (03/10/2008)  Past Surgical History: vesectomy  2/08 plate nd screws in 3rd finger of R hand follow ing fx--19998 ALK--ou 1993 hip replacement 2/11   Review of Systems General:  Complains of fatigue, fever, and loss of appetite; denies sweats. Eyes:  Denies blurring and eye irritation. ENT:  Denies nasal congestion, sinus pressure, and sore throat. CV:  Denies chest pain or discomfort, lightheadness, palpitations, and shortness of breath with exertion. Resp:  Denies cough, pleuritic, shortness of breath, and wheezing. GI:  Complains of abdominal pain, change in bowel habits, diarrhea, gas, and indigestion; denies bloody stools, nausea, and vomiting; stools are darker since starting the iron . GU:  Denies dysuria, hematuria, urinary frequency, and urinary hesitancy. MS:  hip is feeling a lot better - not a lot of pain takes tylenol as needed . Derm:  Denies itching, poor wound healing, and rash. Neuro:  Denies numbness, tingling, and weakness. Endo:  Denies cold intolerance, excessive thirst, excessive urination, and heat intolerance. Heme:  Denies abnormal bruising, bleeding, and enlarge lymph nodes.  Physical Exam  General:  slim and fatigued appearing  Head:  normocephalic, atraumatic, and no abnormalities observed.   Eyes:  vision grossly intact, pupils equal, pupils round, and pupils reactive to light.  no conjunctival pallor, injection or icterus  Mouth:  pharynx pink and moist.   Neck:  No deformities, masses, or tenderness noted. Lungs:  Normal respiratory effort, chest expands symmetrically. Lungs are clear to auscultation, no crackles or wheezes. Heart:  Normal rate and regular rhythm. S1 and S2 normal without gallop, murmur, click, rub or other extra sounds. Abdomen:  tender in epigastrium without gaurding or rebound  soft, normal bowel sounds, no  distention, no masses, no hepatomegaly, and no splenomegaly.   Msk:  No deformity or scoliosis noted of thoracic or lumbar spine.   Extremities:  No clubbing, cyanosis, edema, or deformity noted with normal full range of motion of all joints.   Neurologic:  sensation intact to light touch.  gait is stable with walker  Skin:  Intact without suspicious lesions or rashes Cervical Nodes:  No lymphadenopathy noted Inguinal Nodes:  No significant adenopathy Psych:  fatigued but pleasant    Impression & Recommendations:  Problem # 1:  EPIGASTRIC PAIN (ICD-789.06) Assessment New s/p surgery also with toradol in hosp and significant stress  draw labs today for this and anemia  suspect gastritis - will tx with prilosec 20 mg two times a day (f/u mon or tues) and avoid nsaids rec bland diet if worse/ fever orn/v -- knows to call or go to ER  Orders: Venipuncture (81191) TLB-CBC Platelet - w/Differential (85025-CBCD) TLB-BMP (Basic Metabolic Panel-BMET) (80048-METABOL) Prescription Created Electronically [phone])  Problem # 2:  DIARRHEA (ICD-787.91) Assessment: New s/p constipation from narcotics and laxative use given recent hosp and low grade fever, I want to check for c diff - given sample cup to return Orders: T-Culture, C-Diff Toxin A/B ([id number]) Prescription Created Electronically [phone])  Problem # 3:  ANEMIA, IRON DEFICIENCY (ICD-280.9) Assessment: New  dx in hosp thought to be 2nd to blood loss operative  stool tests in hosp were heme neg will continue iron- but change to Nu iron due to GI tolerability  check cbc today The following medications were removed from the medication list:    Ferrous Sulfate 325 (65 Fe) Mg Tabs (Ferrous sulfate) ..... One tablet once daily His updated medication list for this problem includes:    Vitamin B-12 1000 Mcg Tabs (Cyanocobalamin) .Marland Kitchen... Take one tablet daily    Nu-iron 150 Mg Caps (Polysaccharide iron complex) .Marland Kitchen... 1 by mouth once daily  with food  Orders: Prescription Created Electronically [id number])  Problem # 4:  VITAMIN B12 DEFICIENCY (ICD-266.2) Assessment: New  this was also noted in hosp with level of 238 is on oral B12 currently - and will plan when to re check after next f/u  Orders: Prescription Created Electronically [phone])  Complete Medication List: 1)  Tessalon 200 Mg Caps (Benzonatate) .... One tab by mouth three times a day as needed for cough 2)  Miralax Pack (Polyethylene glycol 3350) .Marland KitchenMarland Kitchen. 17 gms by mouth daily as needed. 3)  Lovenox 40 Mg/0.72ml Soln (Enoxaparin sodium) .... 40mg  subcutaneously daily 4)  Senokot 8.6 Mg Tabs (Sennosides) .... Two tablets by mouth every am 5)  Tylenol Extra Strength 500 Mg Tabs (Acetaminophen) .... Otc as directed. 6)  Oxycodone/acetaminophen ?mg  .... One - three tablets by mouth every 4 hrs as needed. 7)  Vitamin C 500 Mg Tabs (Ascorbic acid) .... One daily 8)  Vitamin B-12 1000 Mcg  Tabs (Cyanocobalamin) .... Take one tablet daily 9)  Prilosec 20 Mg Cpdr (Omeprazole) .Marland Kitchen.. 1 by mouth two times a day 10)  Nu-iron 150 Mg Caps (Polysaccharide iron complex) .Marland Kitchen.. 1 by mouth once daily with food  Patient Instructions: 1)  try to eat a bland diet  bananas/ rice /applesauce/ toast -- small meals 2)  start prilosec 20 mg one pill two times a day -- over the counter and samples  3)  update me if your abdominal pain worsens or fever  4)  follow up with me monday or tuesday  5)  stop your current iron and try nu iron -- it should be easier on your stomach  6)  please bring back stool sample for c diff  7)  labs today  8)  if your symptoms worsen after hours go to the ER  Prescriptions: NU-IRON 150 MG CAPS (POLYSACCHARIDE IRON COMPLEX) 1 by mouth once daily with food  #30 x 3   Entered and Authorized by:   Judith Part MD   Signed by:   Judith Part MD on 11/16/2009   Method used:   Electronically to        [facility]* (retail)       [address]        [place], [place]  [address]       Ph: [phone]       Fax: [phone]   RxID:   [id number]   Current Allergies (reviewed today): No known allergies

## 2010-10-12 NOTE — Letter (Signed)
Summary: [facility]  [facility]   Imported By: Lanelle Bal 10/24/2009 10:19:38  _____________________________________________________________________  External Attachment:    Type:   Image     Comment:   External Document

## 2010-10-12 NOTE — Letter (Signed)
Summary: Meds/[facility]  Meds/[facility]   Imported By: Lanelle Bal 11/16/2009 11:56:37  _____________________________________________________________________  External Attachment:    Type:   Image     Comment:   External Document

## 2010-10-12 NOTE — Assessment & Plan Note (Signed)
Summary: CONGESTION/DLO   Vital Signs:  Patient profile:   45 year old male Height:      67.5 inches Weight:      158.25 pounds BMI:     24.51 Temp:     98 degrees F oral Pulse rate:   72 / minute Pulse rhythm:   regular BP sitting:   108 / 66  (left arm) Cuff size:   regular  Vitals Entered By: Delilah Shan CMA Duncan Dull) (August 08, 2010 8:04 AM) CC: Congestion, cough   History of Present Illness: "I feel horrible."  Sx started last week and went to [facility].  Was told he has a URI.  Had fever then, hasn't noticed fever since then.  Since then patient has had cough, congestion.  Having to use afrin at night to get some sleep.  Was on cheratussin and zithromax.  Was constipated on cheratussin.  Used otc mucus relief and pseudophed and ibuprofen.  Pain in center of chest with cough.  Some episodic sputum, mainly in AM.  A little wheeze per patient.    Allergies: No Known Drug Allergies  Social History: Marital Status: Married Children: 2 Occupation: Nurse, adult for [employer] Never Smoked 1-2 drinks per month maximum  Review of Systems       See HPI.  Otherwise negative.    Physical Exam  General:  GEN: nad, alert and oriented HEENT: mucous membranes moist, TM w/o erythema, nasal epithelium injected, OP with cobblestoning NECK: supple w/o LA CV: rrr. PULM: ctab, no inc wob ABD: soft, +bs EXT: no edema    Impression & Recommendations:  Problem # 1:  UPPER RESPIRATORY INFECTION, ACUTE (ICD-465.9) Likely viral.  I talked with patient about the ddx.  He has had some voice changes which is typical for the viral process in community recently.  i would use the SABA as needed. Sample given and used here in office w/o difficulty.  Use afrin sparingly before sleep and supportive tx o/w.  follow up as needed.  He understood.  Nontoxic.  The following medications were removed from the medication list:    Tylenol Extra Strength 500 Mg Tabs (Acetaminophen) ..... Otc as directed.  Aspirin 325 Mg Tabs (Aspirin) .Marland Kitchen... Take 1 tablet by mouth once a day    Benzonatate 200 Mg Caps (Benzonatate) .Marland KitchenMarland KitchenMarland KitchenMarland Kitchen 3 times daily as needed for cough  Complete Medication List: 1)  Ventolin Hfa 108 (90 Base) Mcg/act Aers (Albuterol sulfate) .... 2 puffs every 4 hours as needed for cough  Patient Instructions: 1)  I would use the inhaler every 4 hours (1-2 puffs) as needed for cough.  Spray the afrin in your nose once a day before sleep, but try not to use this on many consecutive days.  This should gradually improve.  Get plenty of rest, drink lots of clear liquids, and use Tylenol or Ibuprofen for fever and comfort.  Prescriptions: VENTOLIN HFA 108 (90 BASE) MCG/ACT AERS (ALBUTEROL SULFATE) 2 puffs every 4 hours as needed for cough  #1 x 0   Entered and Authorized by:   Crawford Givens MD   Signed by:   Crawford Givens MD on 08/08/2010   Method used:   Samples Given   RxID:   [id number]    Orders Added: 1)  Est. Patient Level III [78469]    Prior Medications (reviewed today): None Current Allergies (reviewed today): No known allergies

## 2010-10-12 NOTE — Assessment & Plan Note (Signed)
Summary: COUGH 15 MIN/DLO   Vital Signs:  Patient profile:   45 year old male Weight:      163.75 pounds BMI:     25.74 Temp:     98.4 degrees F oral Pulse rate:   80 / minute Pulse rhythm:   regular BP sitting:   120 / 72  (left arm) Cuff size:   regular  Vitals Entered By: Linde Gillis CMA Duncan Dull) (September 13, 2009 2:09 PM) CC: coughing x one month   History of Present Illness: Warren Hatfield is a 45 y/o male presenting with 2-3 week history of dry cough which is worse in the morning.  He denies congestion, sputum production, fever, headache, or sinus pain.  He has taken Mucinex and used an occasional cough drop but has still been coughing.    Problems Prior to Update: 1)  Thyroid Stimulating Hormone, Abnormal  (ICD-246.9) 2)  Stress Reaction, Acute  (ICD-308.9) 3)  Ischemic Heart Disease, Premature, Family Hx  (ICD-V17.3) 4)  Health Maintenance Exam  (ICD-V70.0) 5)  Pharyngitis  (ICD-462) 6)  Well Adult Exam  (ICD-V70.0) 7)  Hypothyroidism Nos  (ICD-244.9)  Medications Prior to Update: 1)  None  Allergies (verified): No Known Drug Allergies  Physical Exam  General:  Well-developed,well-nourished,in no acute distress; alert,appropriate and cooperative throughout examination, minimally congested. Head:  normocephalic, atraumatic, and no abnormalities observed.  Sinuses NT. Eyes:  Conjunctiva clear bilaterally.  Ears:  External ear exam shows no significant lesions or deformities.  Otoscopic examination reveals clear canals, tympanic membranes are intact bilaterally without bulging, retraction, inflammation or discharge. Hearing is grossly normal bilaterally. Nose:  no nasal discharge but mild inflammation..   Mouth:  pharynx pink and moist with mild amount white/gray PND.   Neck:  supple with full rom and no masses or thyromegally, no JVD or carotid bruit  Chest Wall:  No deformities, masses, tenderness or gynecomastia noted. Lungs:  Normal respiratory effort, chest  expands symmetrically. Lungs are clear to auscultation, no crackles or wheezes. Heart:  Normal rate and regular rhythm. S1 and S2 normal without gallop, murmur, click, rub or other extra sounds.   Impression & Recommendations:  Problem # 1:  URI (ICD-465.9) Assessment New  See instructions. His updated medication list for this problem includes:    Mobic 7.5 Mg Tabs (Meloxicam) .Marland Kitchen... Take one tablet by mouth as needed    Tessalon 200 Mg Caps (Benzonatate) ..... One tab by mouth three times a day as needed for cough  Instructed on symptomatic treatment. Call if symptoms persist or worsen.   Complete Medication List: 1)  Mobic 7.5 Mg Tabs (Meloxicam) .... Take one tablet by mouth as needed 2)  Tessalon 200 Mg Caps (Benzonatate) .... One tab by mouth three times a day as needed for cough 3)  Zithromax Z-pak 250 Mg Tabs (Azithromycin) .... As dir  Patient Instructions: 1)  Take Guaifenesin by going to [facility] or [facility] and getting MUCOUS RELIEF EXPECTORANT (400mg ), take 11/2 tabs by mouth AM and NOON. 2)  Drink lots of fluids anytime taking Guaifenesin.  3)  Keep lozenge in mouth. 4)  Take Tessalon as needed for cough to blunt not stop. 5)  Gargle with ewarm salt water as dir if needed. 6)  Zithromax if needed Prescriptions: ZITHROMAX Z-PAK 250 MG TABS (AZITHROMYCIN) as dir  #1 pak x 0   Entered and Authorized by:   Warren Leeks MD   Signed by:   Warren Leeks  MD on 09/13/2009   Method used:   Print then Give to Patient   RxID:   [id number] TESSALON 200 MG CAPS (BENZONATATE) one tab by mouth three times a day as needed for cough  #30 x 0   Entered and Authorized by:   Warren Leeks MD   Signed by:   Warren Leeks MD on 09/13/2009   Method used:   Print then Give to Patient   RxID:   [id number]   Current Allergies (reviewed today): No known allergies

## 2010-11-06 ENCOUNTER — Encounter: Payer: Self-pay | Admitting: Family Medicine

## 2010-12-04 LAB — COMPREHENSIVE METABOLIC PANEL
ALT: 154 U/L — ABNORMAL HIGH (ref 0–53)
ALT: 66 U/L — ABNORMAL HIGH (ref 0–53)
AST: 79 U/L — ABNORMAL HIGH (ref 0–37)
AST: 82 U/L — ABNORMAL HIGH (ref 0–37)
Albumin: 2.8 g/dL — ABNORMAL LOW (ref 3.5–5.2)
Alkaline Phosphatase: 39 U/L (ref 39–117)
CO2: 29 mEq/L (ref 19–32)
Calcium: 8.5 mg/dL (ref 8.4–10.5)
Chloride: 106 mEq/L (ref 96–112)
GFR calc Af Amer: >60 mL/min (ref >60)
GFR calc Af Amer: >60 mL/min (ref >60)
Glucose, Bld: 103 mg/dL — ABNORMAL HIGH (ref 70–99)
Potassium: 4 mEq/L (ref 3.5–5.1)
Sodium: 133 mEq/L — ABNORMAL LOW (ref 135–145)
Sodium: 138 mEq/L (ref 135–145)
Total Bilirubin: 0.9 mg/dL (ref 0.3–1.2)
Total Protein: 6.9 g/dL (ref 6.0–8.3)

## 2010-12-04 LAB — POCT I-STAT, CHEM 8
BUN: 9 mg/dL (ref 6–23)
Chloride: 98 mEq/L (ref 96–112)
Creatinine, Ser: 0.7 mg/dL (ref 0.4–1.5)
Hemoglobin: 9.9 g/dL — ABNORMAL LOW (ref 13.0–17.0)
Potassium: 3.9 mEq/L (ref 3.5–5.1)
Sodium: 136 mEq/L (ref 135–145)

## 2010-12-04 LAB — PROTIME-INR: INR: 1.26 (ref 0.00–1.49)

## 2010-12-04 LAB — URINALYSIS, ROUTINE W REFLEX MICROSCOPIC
Ketones, ur: NEGATIVE mg/dL
Leukocytes, UA: NEGATIVE
Nitrite: NEGATIVE
Nitrite: NEGATIVE
Protein, ur: NEGATIVE mg/dL
Specific Gravity, Urine: 1.02 (ref 1.005–1.030)
pH: 6.5 (ref 5.0–8.0)
pH: 6.5 (ref 5.0–8.0)

## 2010-12-04 LAB — BASIC METABOLIC PANEL
BUN: 10 mg/dL (ref 6–23)
Creatinine, Ser: 0.65 mg/dL (ref 0.4–1.5)
GFR calc non Af Amer: >60 mL/min (ref >60)
Potassium: 4 mEq/L (ref 3.5–5.1)

## 2010-12-04 LAB — DIFFERENTIAL
Eosinophils Absolute: 0 10*3/uL (ref 0.0–0.7)
Eosinophils Absolute: 0.1 10*3/uL (ref 0.0–0.7)
Lymphocytes Relative: 10 % — ABNORMAL LOW (ref 12–46)
Lymphs Abs: 1 10*3/uL (ref 0.7–4.0)
Lymphs Abs: 1.2 10*3/uL (ref 0.7–4.0)
Monocytes Relative: 10 % (ref 3–12)
Neutro Abs: 8.6 10*3/uL — ABNORMAL HIGH (ref 1.7–7.7)
Neutrophils Relative %: 78 % — ABNORMAL HIGH (ref 43–77)
Neutrophils Relative %: 83 % — ABNORMAL HIGH (ref 43–77)

## 2010-12-04 LAB — TYPE AND SCREEN
ABO/RH(D): A POS
Antibody Screen: NEGATIVE

## 2010-12-04 LAB — URINE MICROSCOPIC-ADD ON

## 2010-12-04 LAB — CBC
MCHC: 33 g/dL (ref 30.0–36.0)
Platelets: 138 10*3/uL — ABNORMAL LOW (ref 150–400)
Platelets: 224 10*3/uL (ref 150–400)
RDW: 12.7 % (ref 11.5–15.5)
WBC: 10.4 10*3/uL (ref 4.0–10.5)
WBC: 4.2 10*3/uL (ref 4.0–10.5)

## 2010-12-04 LAB — VITAMIN B12: Vitamin B-12: 238 pg/mL (ref 211–911)

## 2010-12-04 LAB — IRON AND TIBC: Saturation Ratios: 6 % — ABNORMAL LOW (ref 20–55)

## 2010-12-04 LAB — URINE CULTURE: Special Requests: NEGATIVE

## 2010-12-04 LAB — MAGNESIUM: Magnesium: 2.2 mg/dL (ref 1.5–2.5)

## 2010-12-04 LAB — RETICULOCYTES
Retic Count, Absolute: 27 10*3/uL (ref 19.0–186.0)
Retic Ct Pct: 0.9 % (ref 0.4–3.1)

## 2010-12-04 LAB — APTT: aPTT: 57 seconds — ABNORMAL HIGH (ref 24–37)

## 2010-12-04 LAB — FOLATE: Folate: 13.9 ng/mL

## 2010-12-04 LAB — HEPATITIS PANEL, ACUTE: Hepatitis B Surface Ag: NEGATIVE

## 2010-12-11 ENCOUNTER — Other Ambulatory Visit: Payer: Self-pay

## 2010-12-22 ENCOUNTER — Encounter: Payer: Self-pay | Admitting: Family Medicine

## 2010-12-22 ENCOUNTER — Telehealth: Payer: Self-pay | Admitting: Family Medicine

## 2010-12-22 DIAGNOSIS — E039 Hypothyroidism, unspecified: Secondary | ICD-10-CM

## 2010-12-22 DIAGNOSIS — Z0000 Encounter for general adult medical examination without abnormal findings: Secondary | ICD-10-CM | POA: Insufficient documentation

## 2010-12-22 NOTE — Telephone Encounter (Signed)
Message copied by Roxy Manns on Fri Dec 22, 2010 11:40 AM ------      Message from: Mills Koller      Created: Thu Dec 21, 2010  4:30 PM       Patient is scheduled for CPX labs, please order future labs, Thanks , Camelia Eng

## 2010-12-27 ENCOUNTER — Other Ambulatory Visit (INDEPENDENT_AMBULATORY_CARE_PROVIDER_SITE_OTHER): Payer: 59 | Admitting: Family Medicine

## 2010-12-27 DIAGNOSIS — E039 Hypothyroidism, unspecified: Secondary | ICD-10-CM

## 2010-12-27 DIAGNOSIS — Z0000 Encounter for general adult medical examination without abnormal findings: Secondary | ICD-10-CM

## 2010-12-27 LAB — CBC WITH DIFFERENTIAL/PLATELET
Basophils Relative: 0.7 % (ref 0.0–3.0)
Eosinophils Relative: 7.9 % — ABNORMAL HIGH (ref 0.0–5.0)
Lymphocytes Relative: 28.2 % (ref 12.0–46.0)
Neutrophils Relative %: 55.4 % (ref 43.0–77.0)
RBC: 4.55 Mil/uL (ref 4.22–5.81)
WBC: 4.8 10*3/uL (ref 4.5–10.5)

## 2010-12-27 LAB — COMPREHENSIVE METABOLIC PANEL
AST: 25 U/L (ref 0–37)
Albumin: 3.8 g/dL (ref 3.5–5.2)
BUN: 15 mg/dL (ref 6–23)
Calcium: 9.1 mg/dL (ref 8.4–10.5)
Chloride: 106 mEq/L (ref 96–112)
Glucose, Bld: 86 mg/dL (ref 70–99)
Potassium: 4.5 mEq/L (ref 3.5–5.1)

## 2010-12-27 LAB — LIPID PANEL
Cholesterol: 164 mg/dL (ref 0–200)
LDL Cholesterol: 110 mg/dL — ABNORMAL HIGH (ref 0–99)

## 2010-12-27 LAB — VITAMIN B12: Vitamin B-12: 297 pg/mL (ref 211–911)

## 2010-12-27 LAB — TSH: TSH: 3.89 u[IU]/mL (ref 0.35–5.50)

## 2011-01-01 ENCOUNTER — Encounter: Payer: Self-pay | Admitting: Family Medicine

## 2011-01-10 ENCOUNTER — Ambulatory Visit (INDEPENDENT_AMBULATORY_CARE_PROVIDER_SITE_OTHER): Payer: 59 | Admitting: Family Medicine

## 2011-01-10 ENCOUNTER — Encounter: Payer: Self-pay | Admitting: Family Medicine

## 2011-01-10 DIAGNOSIS — E538 Deficiency of other specified B group vitamins: Secondary | ICD-10-CM

## 2011-01-10 DIAGNOSIS — Z0000 Encounter for general adult medical examination without abnormal findings: Secondary | ICD-10-CM

## 2011-01-10 NOTE — Progress Notes (Signed)
Subjective:    Patient ID: Warren Hatfield, male    DOB: Mar 29, 1966, 45 y.o.   MRN: [id number]  HPI Here for health mt exam and to review chronic medical problems  Is feeling good  Nothing new medically   Wt is down 4 lb- healthy  Tdap was 08  Needs [employer] form filed out  Past hx of anemia and abn tsh- these are resolved  Lipids are overall good Lab Results  Component Value Date   CHOL 164 12/27/2010   CHOL 153 05/10/2009   CHOL 139 10/29/2006   Lab Results  Component Value Date   HDL 49.50 12/27/2010   HDL 40.98 05/10/2009   HDL 11.9 10/29/2006   Lab Results  Component Value Date   LDLCALC 110* 12/27/2010   LDLCALC 101* 05/10/2009   LDLCALC 84 10/29/2006   Lab Results  Component Value Date   TRIG 25.0 12/27/2010   TRIG 35.0 05/10/2009   TRIG 73 10/29/2006   Lab Results  Component Value Date   CHOLHDL 3 12/27/2010   CHOLHDL 3 05/10/2009   CHOLHDL 3.4 CALC 10/29/2006   Diet- is pretty good No fast food at all  occ fried food  occ red   B12 is in low normal range --  No vitamins now  Some leafy greens - , does not like broccoli   No prostate problems at all - does not have nocturia or urinary symptoms   Past Medical History  Diagnosis Date  . Elevated TSH   . Stress reaction   . Hip pain    Past Surgical History  Procedure Date  . Vasectomy 02/08  . Hand surgery 1998    plate nd screws in 3rd finger of right hand following fx  . Lasik 1993    Both eyes  . Total hip arthroplasty 02/11    reports that he has never smoked. He does not have any smokeless tobacco history on file. He reports that he drinks alcohol. His drug history not on file. family history includes Diabetes in his maternal grandfather and paternal grandmother; Heart disease in his father; Hypertension in his father; and Hypothyroidism in his mother. No Known Allergies     Review of Systems Review of Systems  Constitutional: Negative for fever, appetite change, fatigue and unexpected  weight change.  Eyes: Negative for pain and visual disturbance.  Respiratory: Negative for cough and shortness of breath.   Cardiovascular: Negative.   Gastrointestinal: Negative for nausea, diarrhea and constipation.  Genitourinary: Negative for urgency and frequency.  Skin: Negative for pallor.  Neurological: Negative for weakness, light-headedness, numbness and headaches.  Hematological: Negative for adenopathy. Does not bruise/bleed easily.  Psychiatric/Behavioral: Negative for dysphoric mood. The patient is not nervous/anxious.          Objective:   Physical Exam  Constitutional: He appears well-developed and well-nourished. No distress.  HENT:  Head: Normocephalic and atraumatic.  Right Ear: External ear normal.  Left Ear: External ear normal.  Nose: Nose normal.  Mouth/Throat: Oropharynx is clear and moist.  Eyes: Conjunctivae and EOM are normal. Pupils are equal, round, and reactive to light.  Neck: Normal range of motion. Neck supple. No JVD present. Carotid bruit is not present. No thyromegaly present.  Cardiovascular: Normal rate, regular rhythm and normal heart sounds.   Pulmonary/Chest: Effort normal and breath sounds normal. No respiratory distress.  Abdominal: Soft. Bowel sounds are normal. He exhibits no abdominal bruit and no mass. There is no tenderness.  Genitourinary: Rectum normal  and prostate normal. Guaiac negative stool.  Musculoskeletal: Normal range of motion. He exhibits no tenderness.  Lymphadenopathy:    He has no cervical adenopathy.  Neurological: He is alert. He has normal reflexes. Coordination normal.  Skin: Skin is warm and dry. No erythema. No pallor.       lentigos diffusely  Psychiatric: He has a normal mood and affect.          Assessment & Plan:

## 2011-01-10 NOTE — Patient Instructions (Signed)
Great labs Keep up the good health habits- try to fit in more regular exercise I recommend a B complex vitamin over the counter daily  No restrictions for [employer] duties

## 2011-01-10 NOTE — Assessment & Plan Note (Signed)
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

## 2011-01-10 NOTE — Assessment & Plan Note (Signed)
B12 now in low nl levels I recommend Bcomplex vit otc

## 2011-10-23 ENCOUNTER — Ambulatory Visit (INDEPENDENT_AMBULATORY_CARE_PROVIDER_SITE_OTHER): Payer: 59 | Admitting: Family Medicine

## 2011-10-23 ENCOUNTER — Encounter: Payer: Self-pay | Admitting: Family Medicine

## 2011-10-23 VITALS — BP 118/74 | HR 64 | Temp 98.0°F | Ht 67.25 in | Wt 163.5 lb

## 2011-10-23 DIAGNOSIS — J0100 Acute maxillary sinusitis, unspecified: Secondary | ICD-10-CM | POA: Insufficient documentation

## 2011-10-23 MED ORDER — AMOXICILLIN-POT CLAVULANATE 875-125 MG PO TABS: 1.0000 | ORAL_TABLET | Freq: Two times a day (BID) | ORAL | Status: AC

## 2011-10-23 NOTE — Patient Instructions (Signed)
Continue the current treatment with mucous relief  and nasal saline and advil if needed Take advil if needed for pain and congestion  Take the augmentin for sinus infection as directed Update if not starting to improve in a week or if worsening

## 2011-10-23 NOTE — Assessment & Plan Note (Signed)
With pain /pressure / and transient numbness of L maxillary sinus  Disc symptomatic care - see instructions on AVS  Will cover with augmentin and update  Update if not starting to improve in a week or if worsening

## 2011-10-23 NOTE — Progress Notes (Signed)
Subjective:    Patient ID: Warren Hatfield, male    DOB: 11/15/1965, 46 y.o.   MRN: [id number]  HPI Thinks he has a sinus infection Bad sinus pain and some numbness on L maxillary area - and teeth hurt too  No TMJ No dental problems currently No rash No facial droop   Nasal drainage has color to it   Was getting over a cold-- for over a week  Using netti pot  Took 2 advil and mucous relief pill-and went back to bed Just a little pain above eye No fever    Patient Active Problem List  Diagnoses  . VITAMIN B12 DEFICIENCY  . Routine general medical examination at a health care facility  . Sinusitis, acute, maxillary   Past Medical History  Diagnosis Date  . Elevated TSH   . Stress reaction   . Hip pain    Past Surgical History  Procedure Date  . Vasectomy 02/08  . Hand surgery 1998    plate nd screws in 3rd finger of right hand following fx  . Lasik 1993    Both eyes  . Total hip arthroplasty 02/11   History  Substance Use Topics  . Smoking status: Never Smoker   . Smokeless tobacco: Not on file  . Alcohol Use: Yes     1-2 drinks per month maximum   Family History  Problem Relation Age of Onset  . Hypothyroidism Mother   . Heart disease Father     MI  . Hypertension Father   . Diabetes Maternal Grandfather   . Diabetes Paternal Grandmother    No Known Allergies No current outpatient prescriptions on file prior to visit.      Review of Systems Review of Systems  Constitutional: Negative for fever, appetite change, fatigue and unexpected weight change.  Eyes: Negative for pain and visual disturbance.  ENT pos for congestion and sinus pain and st Respiratory: Negative for sob or wheeze Cardiovascular: Negative for cp or palpitations    Gastrointestinal: Negative for nausea, diarrhea and constipation.  Genitourinary: Negative for urgency and frequency.  Skin: Negative for pallor or rash   Neurological: Negative for weakness, light-headedness,  numbness and headaches.  Hematological: Negative for adenopathy. Does not bruise/bleed easily.  Psychiatric/Behavioral: Negative for dysphoric mood. The patient is not nervous/anxious.          Objective:   Physical Exam  Constitutional: He appears well-developed and well-nourished. No distress.  HENT:  Head: Normocephalic and atraumatic.  Right Ear: External ear normal.  Left Ear: External ear normal.  Mouth/Throat: Oropharynx is clear and moist. No oropharyngeal exudate.       Nares are injected and congested  L maxillary sinus tenderness Mild ethmoid sinus tenderness bilat  Post nasal drip noted   Eyes: Conjunctivae and EOM are normal. Pupils are equal, round, and reactive to light. Right eye exhibits no discharge. Left eye exhibits no discharge.  Neck: Normal range of motion. Neck supple. No JVD present. No thyromegaly present.  Cardiovascular: Normal rate, regular rhythm, normal heart sounds and intact distal pulses.  Exam reveals no gallop.   Pulmonary/Chest: Effort normal and breath sounds normal. No respiratory distress. He has no wheezes.  Lymphadenopathy:    He has no cervical adenopathy.  Neurological: He is alert. He has normal reflexes. No cranial nerve deficit.  Skin: Skin is warm and dry. No rash noted. No erythema.  Psychiatric: He has a normal mood and affect.  Assessment & Plan:

## 2012-07-14 ENCOUNTER — Telehealth: Payer: Self-pay | Admitting: Family Medicine

## 2012-07-14 DIAGNOSIS — Z0000 Encounter for general adult medical examination without abnormal findings: Secondary | ICD-10-CM

## 2012-07-14 DIAGNOSIS — E538 Deficiency of other specified B group vitamins: Secondary | ICD-10-CM

## 2012-07-14 NOTE — Telephone Encounter (Signed)
Message copied by Judy Pimple on Mon Jul 14, 2012  7:41 PM ------      Message from: Baldomero Lamy      Created: Thu Jul 10, 2012  7:14 AM      Regarding: Cpx labs Tues 11/5       Please order  future cpx labs for pt's upcomming lab appt.      Thanks      Rodney Booze

## 2012-07-15 ENCOUNTER — Other Ambulatory Visit: Payer: 59

## 2012-07-22 ENCOUNTER — Encounter: Payer: Self-pay | Admitting: Family Medicine

## 2012-07-22 ENCOUNTER — Ambulatory Visit (INDEPENDENT_AMBULATORY_CARE_PROVIDER_SITE_OTHER): Payer: 59 | Admitting: Family Medicine

## 2012-07-22 VITALS — BP 126/64 | HR 64 | Temp 98.2°F | Ht 68.0 in | Wt 164.5 lb

## 2012-07-22 DIAGNOSIS — Z0000 Encounter for general adult medical examination without abnormal findings: Secondary | ICD-10-CM

## 2012-07-22 DIAGNOSIS — E538 Deficiency of other specified B group vitamins: Secondary | ICD-10-CM

## 2012-07-22 DIAGNOSIS — Z23 Encounter for immunization: Secondary | ICD-10-CM

## 2012-07-22 LAB — LIPID PANEL
LDL Cholesterol: 119 mg/dL — ABNORMAL HIGH (ref 0–99)
Total CHOL/HDL Ratio: 3
VLDL: 7.4 mg/dL (ref 0.0–40.0)

## 2012-07-22 LAB — COMPREHENSIVE METABOLIC PANEL
ALT: 30 U/L (ref 0–53)
AST: 34 U/L (ref 0–37)
Albumin: 3.9 g/dL (ref 3.5–5.2)
Alkaline Phosphatase: 56 U/L (ref 39–117)
Potassium: 4 mEq/L (ref 3.5–5.1)
Sodium: 139 mEq/L (ref 135–145)
Total Bilirubin: 0.6 mg/dL (ref 0.3–1.2)
Total Protein: 6.6 g/dL (ref 6.0–8.3)

## 2012-07-22 LAB — CBC WITH DIFFERENTIAL/PLATELET
Basophils Absolute: 0 10*3/uL (ref 0.0–0.1)
Eosinophils Absolute: 0.1 10*3/uL (ref 0.0–0.7)
Eosinophils Relative: 1.6 % (ref 0.0–5.0)
Lymphs Abs: 1.4 10*3/uL (ref 0.7–4.0)
MCV: 91.3 fl (ref 78.0–100.0)
Monocytes Absolute: 0.4 10*3/uL (ref 0.1–1.0)
Neutrophils Relative %: 58.4 % (ref 43.0–77.0)
Platelets: 220 10*3/uL (ref 150.0–400.0)
RDW: 13.1 % (ref 11.5–14.6)
WBC: 4.5 10*3/uL (ref 4.5–10.5)

## 2012-07-22 LAB — TSH: TSH: 3.7 u[IU]/mL (ref 0.35–5.50)

## 2012-07-22 NOTE — Assessment & Plan Note (Signed)
Pt is not totally compliant with supplementation Diet is good  Level today

## 2012-07-22 NOTE — Patient Instructions (Addendum)
Labs today  Flu shot today Take care of yourself  Stay active

## 2012-07-22 NOTE — Progress Notes (Signed)
Subjective:    Patient ID: Warren Hatfield, male    DOB: 02-23-1966, 46 y.o.   MRN: [id number]  HPI Here for health maintenance exam and to review chronic medical problems    Is doing ok overall  Nothing new going on  Is taking care of himself   Less time to exercise - very busy  Still eats a very healthy diet    Will need labs today Ate oatmeal today    Flu vaccine - has not had one yet   Wt is good  bp is good   Hx of B12 def  Lab Results  Component Value Date   VITAMINB12 297 12/27/2010  is on and off with his B12 supplement   Family hx -  Mother had breast cancer  No prostate cancer  No colon cancer   No prostate symptoms at all  No nocturia for the most part No trouble with stream or frequency   Patient Active Problem List  Diagnosis  . VITAMIN B12 DEFICIENCY  . Routine general medical examination at a health care facility   Past Medical History  Diagnosis Date  . Elevated TSH   . Stress reaction   . Hip pain    Past Surgical History  Procedure Date  . Vasectomy 02/08  . Hand surgery 1998    plate nd screws in 3rd finger of right hand following fx  . Lasik 1993    Both eyes  . Total hip arthroplasty 02/11   History  Substance Use Topics  . Smoking status: Never Smoker   . Smokeless tobacco: Not on file  . Alcohol Use: Yes     Comment: 1-2 drinks per month maximum   Family History  Problem Relation Age of Onset  . Hypothyroidism Mother   . Heart disease Father     MI  . Hypertension Father   . Diabetes Maternal Grandfather   . Diabetes Paternal Grandmother    No Known Allergies Current Outpatient Prescriptions on File Prior to Visit  Medication Sig Dispense Refill  . ibuprofen (ADVIL,MOTRIN) 200 MG tablet Take 400 mg by mouth every 6 (six) hours as needed.          Review of Systems Review of Systems  Constitutional: Negative for fever, appetite change, fatigue and unexpected weight change.  Eyes: Negative for pain and visual  disturbance.  Respiratory: Negative for cough and shortness of breath.   Cardiovascular: Negative for cp or palpitations    Gastrointestinal: Negative for nausea, diarrhea and constipation.  Genitourinary: Negative for urgency and frequency.  Skin: Negative for pallor or rash   Neurological: Negative for weakness, light-headedness, numbness and headaches.  Hematological: Negative for adenopathy. Does not bruise/bleed easily.  Psychiatric/Behavioral: Negative for dysphoric mood. The patient is not nervous/anxious.         Objective:   Physical Exam  Constitutional: He appears well-developed and well-nourished. No distress.  HENT:  Head: Normocephalic and atraumatic.  Right Ear: External ear normal.  Left Ear: External ear normal.  Nose: Nose normal.  Mouth/Throat: Oropharynx is clear and moist.  Eyes: Conjunctivae normal and EOM are normal. Pupils are equal, round, and reactive to light. Right eye exhibits no discharge. Left eye exhibits no discharge. No scleral icterus.  Neck: Normal range of motion. Neck supple. No JVD present. Carotid bruit is not present. No thyromegaly present.  Cardiovascular: Normal rate, regular rhythm, normal heart sounds and intact distal pulses.  Exam reveals no gallop.   Pulmonary/Chest: Effort normal  and breath sounds normal. No respiratory distress. He has no wheezes.  Abdominal: Soft. Bowel sounds are normal. He exhibits no distension, no abdominal bruit and no mass. There is no tenderness.  Musculoskeletal: Normal range of motion. He exhibits no edema and no tenderness.  Lymphadenopathy:    He has no cervical adenopathy.  Neurological: He is alert. He has normal reflexes. No cranial nerve deficit. He exhibits normal muscle tone. Coordination normal.  Skin: Skin is warm and dry. No rash noted. No erythema. No pallor.  Psychiatric: He has a normal mood and affect.          Assessment & Plan:

## 2012-07-22 NOTE — Assessment & Plan Note (Signed)
Reviewed health habits including diet and exercise and skin cancer prevention Also reviewed health mt list, fam hx and immunizations   Wellness labs today Flu shot today Disc prostate screening in detail - without fam hx /personal hx of symptoms will skip DRE this year - pt in agreement

## 2012-07-23 ENCOUNTER — Encounter: Payer: Self-pay | Admitting: *Deleted

## 2014-04-05 ENCOUNTER — Telehealth: Payer: Self-pay | Admitting: Family Medicine

## 2014-04-05 DIAGNOSIS — E538 Deficiency of other specified B group vitamins: Secondary | ICD-10-CM

## 2014-04-05 DIAGNOSIS — Z0000 Encounter for general adult medical examination without abnormal findings: Secondary | ICD-10-CM

## 2014-04-05 NOTE — Telephone Encounter (Signed)
Message copied by Abner Greenspan on Mon Apr 05, 2014 10:14 PM ------      Message from: Ellamae Sia      Created: Wed Mar 31, 2014  3:54 PM      Regarding: Lab orders for Tuesday, 7.28.15       Patient is scheduled for CPX labs, please order future labs, Thanks , Terri       ------

## 2014-04-08 ENCOUNTER — Other Ambulatory Visit (INDEPENDENT_AMBULATORY_CARE_PROVIDER_SITE_OTHER): Payer: 59

## 2014-04-08 DIAGNOSIS — E538 Deficiency of other specified B group vitamins: Secondary | ICD-10-CM

## 2014-04-08 DIAGNOSIS — Z0000 Encounter for general adult medical examination without abnormal findings: Secondary | ICD-10-CM

## 2014-04-08 LAB — CBC WITH DIFFERENTIAL/PLATELET
BASOS ABS: 0 10*3/uL (ref 0.0–0.1)
Basophils Relative: 0.4 % (ref 0.0–3.0)
EOS ABS: 0.2 10*3/uL (ref 0.0–0.7)
Eosinophils Relative: 4.5 % (ref 0.0–5.0)
HCT: 41.7 % (ref 39.0–52.0)
Hemoglobin: 14.1 g/dL (ref 13.0–17.0)
Lymphocytes Relative: 40.2 % (ref 12.0–46.0)
Lymphs Abs: 2.1 10*3/uL (ref 0.7–4.0)
MCHC: 33.9 g/dL (ref 30.0–36.0)
MCV: 91.1 fl (ref 78.0–100.0)
MONO ABS: 0.4 10*3/uL (ref 0.1–1.0)
Monocytes Relative: 8.2 % (ref 3.0–12.0)
NEUTROS PCT: 46.7 % (ref 43.0–77.0)
Neutro Abs: 2.5 10*3/uL (ref 1.4–7.7)
PLATELETS: 216 10*3/uL (ref 150.0–400.0)
RBC: 4.58 Mil/uL (ref 4.22–5.81)
RDW: 13 % (ref 11.5–15.5)
WBC: 5.3 10*3/uL (ref 4.0–10.5)

## 2014-04-08 LAB — COMPREHENSIVE METABOLIC PANEL
ALBUMIN: 3.8 g/dL (ref 3.5–5.2)
ALK PHOS: 55 U/L (ref 39–117)
ALT: 33 U/L (ref 0–53)
AST: 29 U/L (ref 0–37)
BUN: 16 mg/dL (ref 6–23)
CO2: 26 mEq/L (ref 19–32)
Calcium: 9 mg/dL (ref 8.4–10.5)
Chloride: 107 mEq/L (ref 96–112)
Creatinine, Ser: 1 mg/dL (ref 0.4–1.5)
GFR: 85.37 mL/min (ref >60.00)
Glucose, Bld: 95 mg/dL (ref 70–99)
POTASSIUM: 4 meq/L (ref 3.5–5.1)
SODIUM: 139 meq/L (ref 135–145)
TOTAL PROTEIN: 6.5 g/dL (ref 6.0–8.3)
Total Bilirubin: 0.7 mg/dL (ref 0.2–1.2)

## 2014-04-08 LAB — LIPID PANEL
Cholesterol: 174 mg/dL (ref 0–200)
HDL: 45.6 mg/dL (ref >39.00)
LDL Cholesterol: 119 mg/dL — ABNORMAL HIGH (ref 0–99)
NonHDL: 128.4
Total CHOL/HDL Ratio: 4
Triglycerides: 48 mg/dL (ref 0.0–149.0)
VLDL: 9.6 mg/dL (ref 0.0–40.0)

## 2014-04-08 LAB — TSH: TSH: 0.71 u[IU]/mL (ref 0.35–4.50)

## 2014-04-08 LAB — VITAMIN B12: Vitamin B-12: 287 pg/mL (ref 211–911)

## 2014-04-13 ENCOUNTER — Ambulatory Visit (INDEPENDENT_AMBULATORY_CARE_PROVIDER_SITE_OTHER): Payer: 59 | Admitting: Family Medicine

## 2014-04-13 ENCOUNTER — Encounter: Payer: Self-pay | Admitting: Family Medicine

## 2014-04-13 VITALS — BP 126/64 | HR 68 | Temp 98.0°F | Ht 67.0 in | Wt 165.5 lb

## 2014-04-13 DIAGNOSIS — Z0000 Encounter for general adult medical examination without abnormal findings: Secondary | ICD-10-CM

## 2014-04-13 NOTE — Patient Instructions (Signed)
Take your B complex vitamin daily  Drink fluids when working out in the heat Take care of yourself   Labs are stable

## 2014-04-13 NOTE — Progress Notes (Signed)
Pre visit review using our clinic review tool, if applicable. No additional management support is needed unless otherwise documented below in the visit note. 

## 2014-04-13 NOTE — Progress Notes (Signed)
Subjective:    Patient ID: Warren Hatfield, male    DOB: September 26, 1965, 48 y.o.   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) He is about 8 years from retirement - working on [employer] (has to do this for a while)  He does enjoy the work and he gets to supervise   Mood is generally good    Did not get a flu shot in the fall but plans to get one this fall  Td 2/08   Eating a fairly healthy diet    His hip (that was replaced) is doing well overall - gets a bit sore if he runs a long distance  They are checking his blood for cobalt and chromium --was neg   Results for orders placed in visit on 04/08/14  VITAMIN B12      Result Value Ref Range   Vitamin B-12 287  211 - 911 pg/mL  CBC WITH DIFFERENTIAL      Result Value Ref Range   WBC 5.3  4.0 - 10.5 K/uL   RBC 4.58  4.22 - 5.81 Mil/uL   Hemoglobin 14.1  13.0 - 17.0 g/dL   HCT 41.7  39.0 - 52.0 %   MCV 91.1  78.0 - 100.0 fl   MCHC 33.9  30.0 - 36.0 g/dL   RDW 13.0  11.5 - 15.5 %   Platelets 216.0  150.0 - 400.0 K/uL   Neutrophils Relative % 46.7  43.0 - 77.0 %   Lymphocytes Relative 40.2  12.0 - 46.0 %   Monocytes Relative 8.2  3.0 - 12.0 %   Eosinophils Relative 4.5  0.0 - 5.0 %   Basophils Relative 0.4  0.0 - 3.0 %   Neutro Abs 2.5  1.4 - 7.7 K/uL   Lymphs Abs 2.1  0.7 - 4.0 K/uL   Monocytes Absolute 0.4  0.1 - 1.0 K/uL   Eosinophils Absolute 0.2  0.0 - 0.7 K/uL   Basophils Absolute 0.0  0.0 - 0.1 K/uL  COMPREHENSIVE METABOLIC PANEL      Result Value Ref Range   Sodium 139  135 - 145 mEq/L   Potassium 4.0  3.5 - 5.1 mEq/L   Chloride 107  96 - 112 mEq/L   CO2 26  19 - 32 mEq/L   Glucose, Bld 95  70 - 99 mg/dL   BUN 16  6 - 23 mg/dL   Creatinine, Ser 1.0  0.4 - 1.5 mg/dL   Total Bilirubin 0.7  0.2 - 1.2 mg/dL   Alkaline Phosphatase 55  39 - 117 U/L   AST 29  0 - 37 U/L   ALT 33  0 - 53 U/L   Total Protein 6.5  6.0 - 8.3 g/dL   Albumin 3.8  3.5 - 5.2 g/dL   Calcium 9.0  8.4 - 10.5 mg/dL   GFR 85.37  >60.00 mL/min  LIPID PANEL      Result Value Ref Range   Cholesterol 174  0 - 200 mg/dL   Triglycerides 48.0  0.0 - 149.0 mg/dL   HDL 45.60  >39.00 mg/dL   VLDL 9.6  0.0 - 40.0 mg/dL   LDL Cholesterol 119 (*) 0 -  99 mg/dL   Total CHOL/HDL Ratio 4     NonHDL 128.40    TSH      Result Value Ref Range   TSH 0.71  0.35 - 4.50 uIU/mL    Overall stable   Patient Active Problem List   Diagnosis Date Noted  . Routine general medical examination at a health care facility 12/22/2010  . VITAMIN B12 DEFICIENCY 11/16/2009   Past Medical History  Diagnosis Date  . Elevated TSH   . Stress reaction   . Hip pain    Past Surgical History  Procedure Laterality Date  . Vasectomy  02/08  . Hand surgery  1998    plate nd screws in 3rd finger of right hand following fx  . [place]  . Total hip arthroplasty  02/11   History  Substance Use Topics  . Smoking status: Never Smoker   . Smokeless tobacco: Not on file  . Alcohol Use: Yes     Comment: 1-2 drinks per month maximum   Family History  Problem Relation Age of Onset  . Hypothyroidism Mother   . Heart disease Father     MI  . Hypertension Father   . Diabetes Maternal Grandfather   . Diabetes Paternal Grandmother    No Known Allergies No current outpatient prescriptions on file prior to visit.   No current facility-administered medications on file prior to visit.     Review of Systems    Review of Systems  Constitutional: Negative for fever, appetite change, fatigue and unexpected weight change.  Eyes: Negative for pain and visual disturbance.  Respiratory: Negative for cough and shortness of breath.   Cardiovascular: Negative for cp or palpitations    Gastrointestinal:  Negative for nausea, diarrhea and constipation.  Genitourinary: Negative for urgency and frequency.  Skin: Negative for pallor or rash   Neurological: Negative for weakness, light-headedness, numbness and headaches.  Hematological: Negative for adenopathy. Does not bruise/bleed easily.  Psychiatric/Behavioral: Negative for dysphoric mood. The patient is not nervous/anxious.      Objective:   Physical Exam  Constitutional: He appears well-developed and well-nourished. No distress.  HENT:  Head: Normocephalic and atraumatic.  Right Ear: External ear normal.  Left Ear: External ear normal.  Nose: Nose normal.  Mouth/Throat: Oropharynx is clear and moist.  Eyes: Conjunctivae and EOM are normal. Pupils are equal, round, and reactive to light. Right eye exhibits no discharge. Left eye exhibits no discharge. No scleral icterus.  Neck: Normal range of motion. Neck supple. No JVD present. Carotid bruit is not present. No thyromegaly present.  Cardiovascular: Normal rate, regular rhythm, normal heart sounds and intact distal pulses.  Exam reveals no gallop.   Pulmonary/Chest: Effort normal and breath sounds normal. No respiratory distress. He has no wheezes. He exhibits no tenderness.  Abdominal: Soft. Bowel sounds are normal. He exhibits no distension, no abdominal bruit and no mass. There is no tenderness.  Genitourinary:  DRE deferred- no symptoms or family hx   Musculoskeletal: He exhibits no edema and no tenderness.  Lymphadenopathy:    He has no cervical adenopathy.  Neurological: He is alert. He has normal reflexes. No cranial nerve deficit. He exhibits normal muscle tone. Coordination normal.  Skin: Skin is warm and dry. No rash noted. No erythema. No pallor.  Tanned with lentigos diffusely  Psychiatric: He has a normal mood and affect.          Assessment & Plan:   Problem  List Items Addressed This Visit     Other   Routine general medical examination at a health care  facility - Primary     Reviewed health habits including diet and exercise and skin cancer prevention Reviewed appropriate screening tests for age  Also reviewed health mt list, fam hx and immunization status , as well as social and family history   Enc to keep exercising  See HPI Labs rev Will get a flu shot in the fall

## 2014-04-14 NOTE — Assessment & Plan Note (Signed)
Reviewed health habits including diet and exercise and skin cancer prevention Reviewed appropriate screening tests for age  Also reviewed health mt list, fam hx and immunization status , as well as social and family history   Enc to keep exercising  See HPI Labs rev Will get a flu shot in the fall

## 2015-04-26 ENCOUNTER — Ambulatory Visit: Payer: Self-pay | Admitting: Family Medicine

## 2015-04-26 ENCOUNTER — Telehealth: Payer: Self-pay | Admitting: Family Medicine

## 2015-04-26 DIAGNOSIS — Z0289 Encounter for other administrative examinations: Secondary | ICD-10-CM

## 2015-04-26 NOTE — Telephone Encounter (Signed)
Just please make sure he is all right- no f/u needed if he is feeling better

## 2015-04-26 NOTE — Telephone Encounter (Signed)
Patient did not come in for their appointment today for dizzy spell.  Please let me know if patient needs to be contacted immediately for follow up or no follow up needed.

## 2015-05-10 ENCOUNTER — Encounter: Payer: Self-pay | Admitting: Family Medicine

## 2015-05-10 ENCOUNTER — Ambulatory Visit (INDEPENDENT_AMBULATORY_CARE_PROVIDER_SITE_OTHER): Payer: 59 | Admitting: Family Medicine

## 2015-05-10 ENCOUNTER — Ambulatory Visit: Payer: Self-pay | Admitting: Family Medicine

## 2015-05-10 VITALS — BP 132/68 | HR 69 | Temp 98.1°F | Ht 67.0 in | Wt 177.0 lb

## 2015-05-10 DIAGNOSIS — R42 Dizziness and giddiness: Secondary | ICD-10-CM

## 2015-05-10 DIAGNOSIS — E538 Deficiency of other specified B group vitamins: Secondary | ICD-10-CM

## 2015-05-10 DIAGNOSIS — Z13220 Encounter for screening for lipoid disorders: Secondary | ICD-10-CM | POA: Insufficient documentation

## 2015-05-10 LAB — COMPREHENSIVE METABOLIC PANEL
ALBUMIN: 4.1 g/dL (ref 3.5–5.2)
ALT: 29 U/L (ref 0–53)
AST: 25 U/L (ref 0–37)
Alkaline Phosphatase: 59 U/L (ref 39–117)
BUN: 14 mg/dL (ref 6–23)
CALCIUM: 9 mg/dL (ref 8.4–10.5)
CO2: 28 mEq/L (ref 19–32)
Chloride: 105 mEq/L (ref 96–112)
Creatinine, Ser: 1 mg/dL (ref 0.40–1.50)
GFR: 84.97 mL/min (ref >60.00)
Glucose, Bld: 96 mg/dL (ref 70–99)
POTASSIUM: 4.1 meq/L (ref 3.5–5.1)
Sodium: 138 mEq/L (ref 135–145)
Total Bilirubin: 0.6 mg/dL (ref 0.2–1.2)
Total Protein: 6.6 g/dL (ref 6.0–8.3)

## 2015-05-10 LAB — CBC WITH DIFFERENTIAL/PLATELET
BASOS PCT: 0.6 % (ref 0.0–3.0)
Basophils Absolute: 0 10*3/uL (ref 0.0–0.1)
EOS PCT: 1.6 % (ref 0.0–5.0)
Eosinophils Absolute: 0.1 10*3/uL (ref 0.0–0.7)
HEMATOCRIT: 42.4 % (ref 39.0–52.0)
HEMOGLOBIN: 14.2 g/dL (ref 13.0–17.0)
LYMPHS PCT: 25.5 % (ref 12.0–46.0)
Lymphs Abs: 1.4 10*3/uL (ref 0.7–4.0)
MCHC: 33.6 g/dL (ref 30.0–36.0)
MCV: 90.7 fl (ref 78.0–100.0)
MONOS PCT: 6.8 % (ref 3.0–12.0)
Monocytes Absolute: 0.4 10*3/uL (ref 0.1–1.0)
Neutro Abs: 3.7 10*3/uL (ref 1.4–7.7)
Neutrophils Relative %: 65.5 % (ref 43.0–77.0)
Platelets: 220 10*3/uL (ref 150.0–400.0)
RBC: 4.67 Mil/uL (ref 4.22–5.81)
RDW: 12.7 % (ref 11.5–15.5)
WBC: 5.6 10*3/uL (ref 4.0–10.5)

## 2015-05-10 LAB — LIPID PANEL
Cholesterol: 165 mg/dL (ref 0–200)
HDL: 41.3 mg/dL (ref >39.00)
LDL Cholesterol: 110 mg/dL — ABNORMAL HIGH (ref 0–99)
NONHDL: 124.03
TRIGLYCERIDES: 71 mg/dL (ref 0.0–149.0)
Total CHOL/HDL Ratio: 4
VLDL: 14.2 mg/dL (ref 0.0–40.0)

## 2015-05-10 LAB — TSH: TSH: 4.33 u[IU]/mL (ref 0.35–4.50)

## 2015-05-10 LAB — VITAMIN B12: VITAMIN B 12: 344 pg/mL (ref 211–911)

## 2015-05-10 NOTE — Progress Notes (Signed)
Pre visit review using our clinic review tool, if applicable. No additional management support is needed unless otherwise documented below in the visit note. 

## 2015-05-10 NOTE — Patient Instructions (Signed)
I think your episodic dizziness is multifactorial ( and it can cause anxiety that worsens everything) Try to eat regular meals and also don't skimp on hydration Try your best to get regular sleep (though I know this is difficult)  If ears bother you or you develop sinus congestion let me know - this could add to inner ear type of dizziness (vertigo)  If you develop an episode that does not go away-alert me    If not improving in the next several weeks also alert me    Labs today

## 2015-05-10 NOTE — Progress Notes (Signed)
Subjective:    Patient ID: Warren Hatfield, male    DOB: 12/14/1965, 49 y.o.   MRN: [id number]  HPI Here for dizziness today   Several months ago had first episode , [place] , he got suddenly dizzy looking at a menu  Lasted 30 seconds - did not fall/ no trauma   Early Aug was on field trip with son's [employer] (strenuous) , after eating lunch and sat down on the bus/ lasted 30-60 seconds again   Mid Aug - 12:30 in the afternoon at work (had not eaten all day)- another episode  Aug 25th - happened again at lunchtime after skipping breakfast   (he normally eats breakfast but there are days he does not get time)  Stress lately - his wife had an emergency C-S  Now sleep is sketchy with new baby  Episodes feel like "the world is moving" Trouble focusing - but if he covers one eye that helps - and tries to focus on one thing and take off his glasses   ? If it feels like the room is spinning a little  Does not have to lie down  No falls Has not staggered into walls  No nausea  Gets a little panicky   No episode lasts very long   But if he worries about it - then it makes him have other symptoms - anxiety or head pressure  He could almost force it to happen   Baby born 8/16 He is back to work now  Wife gets up at night -but he still wakes up  He is back to work    Patient Active Problem List   Diagnosis Date Noted  . Dizziness 05/10/2015  . Screening for lipoid disorders 05/10/2015  . Routine general medical examination at a health care facility 12/22/2010  . B12 deficiency 11/16/2009   Past Medical History  Diagnosis Date  . Elevated TSH   . Stress reaction   . Hip pain    Past Surgical History  Procedure Laterality Date  . Vasectomy  02/08  . Hand surgery  1998    plate nd screws in 3rd finger of right hand following fx  . [place]  . Total hip arthroplasty  02/11   Social History  Substance Use Topics  .  Smoking status: Never Smoker   . Smokeless tobacco: None  . Alcohol Use: 0.0 oz/week    0 Standard drinks or equivalent per week     Comment: 1-2 drinks per month maximum   Family History  Problem Relation Age of Onset  . Hypothyroidism Mother   . Heart disease Father     MI  . Hypertension Father   . Diabetes Maternal Grandfather   . Diabetes Paternal Grandmother    No Known Allergies No current outpatient prescriptions on file prior to visit.   No current facility-administered medications on file prior to visit.    Review of Systems Review of Systems  Constitutional: Negative for fever, appetite change,  and unexpected weight change. pos for fatigue and sleep deprivation  Eyes: Negative for pain and visual disturbance.  ENT neg for ear pain/drainage or significant congestion or sinus pain or pressure  Respiratory: Negative for cough and shortness of breath.   Cardiovascular: Negative for cp or palpitations    Gastrointestinal: Negative for nausea, diarrhea and constipation.  Genitourinary: Negative for urgency and frequency.  Skin: Negative  for pallor or rash   Neurological: Negative for weakness,  numbness and headaches. neg for speech slurring or unilateral symptoms  Hematological: Negative for adenopathy. Does not bruise/bleed easily.  Psychiatric/Behavioral: Negative for dysphoric mood. The patient is not nervous/anxious.         Objective:   Physical Exam  Constitutional: He is oriented to person, place, and time. He appears well-developed and well-nourished. No distress.  Well appearing   HENT:  Head: Normocephalic and atraumatic.  Right Ear: External ear normal.  Left Ear: External ear normal.  Nose: Nose normal.  Mouth/Throat: Oropharynx is clear and moist. No oropharyngeal exudate.  No sinus tenderness No temporal tenderness  No TMJ tenderness  Eyes: Conjunctivae and EOM are normal. Pupils are equal, round, and reactive to light. Right eye exhibits no  discharge. Left eye exhibits no discharge. No scleral icterus.  Few beats of horizontal  Nystagmus-bilateral (without rep of dizziness)  Neck: Normal range of motion and full passive range of motion without pain. Neck supple. No JVD present. Carotid bruit is not present. No tracheal deviation present. No thyromegaly present.  Cardiovascular: Normal rate, regular rhythm and normal heart sounds.   No murmur heard. Pulmonary/Chest: Effort normal and breath sounds normal. No respiratory distress. He has no wheezes. He has no rales.  Abdominal: Soft. Bowel sounds are normal. He exhibits no distension and no mass. There is no tenderness.  Musculoskeletal: He exhibits no edema or tenderness.  Lymphadenopathy:    He has no cervical adenopathy.  Neurological: He is alert and oriented to person, place, and time. He has normal strength and normal reflexes. He displays tremor. He displays no atrophy. No cranial nerve deficit or sensory deficit. He exhibits normal muscle tone. He displays a negative Romberg sign. Coordination and gait normal.  No focal cerebellar signs   Hand tremor notable on intention (pt seems anxious)  Skin: Skin is warm and dry. No rash noted. No pallor.  Psychiatric: His speech is normal and behavior is normal. Thought content normal. His mood appears anxious. His affect is not blunt, not labile and not inappropriate. He does not exhibit a depressed mood.  Pt seems mildly anxious           Assessment & Plan:   Problem List Items Addressed This Visit    B12 deficiency    Lab today Disc need for a balanced diet  Pt is having dizziness- ? If related       Relevant Orders   Vitamin B12 (Completed)   Dizziness - Primary    Episodic dizziness that may be positional - started after a trip to the mountains, then re occurred several times in the setting of hunger (skipping meals)  Also assoc with anxiety (and stressors and sleep deprivation-new baby at home) Suspect  multifactorial - likely began with middle ear problem  Reassuring neuro exam  Labs today  Disc imp of good nutrition /hydration and sleep (best he can do) Treat any allergy problems or congestion Pursue addnl w/u if this does not resolve        Relevant Orders   CBC with Differential/Platelet (Completed)   Comprehensive metabolic panel (Completed)   TSH (Completed)   Screening for lipoid disorders    Eating less lately  Pt states diet choices are good  Lab today  Rev low sat fat diet       Relevant Orders   Lipid panel (Completed)

## 2015-05-11 ENCOUNTER — Encounter: Payer: Self-pay | Admitting: *Deleted

## 2015-05-11 NOTE — Assessment & Plan Note (Signed)
Eating less lately  Pt states diet choices are good  Lab today  Rev low sat fat diet

## 2015-05-11 NOTE — Assessment & Plan Note (Signed)
Episodic dizziness that may be positional - started after a trip to the mountains, then re occurred several times in the setting of hunger (skipping meals)  Also assoc with anxiety (and stressors and sleep deprivation-new baby at home) Suspect multifactorial - likely began with middle ear problem  Reassuring neuro exam  Labs today  Disc imp of good nutrition /hydration and sleep (best he can do) Treat any allergy problems or congestion Pursue addnl w/u if this does not resolve

## 2015-05-11 NOTE — Assessment & Plan Note (Signed)
Lab today Disc need for a balanced diet  Pt is having dizziness- ? If related

## 2015-05-30 ENCOUNTER — Encounter: Payer: Self-pay | Admitting: Family Medicine

## 2015-05-30 ENCOUNTER — Ambulatory Visit (INDEPENDENT_AMBULATORY_CARE_PROVIDER_SITE_OTHER): Payer: 59 | Admitting: Family Medicine

## 2015-05-30 VITALS — BP 138/78 | HR 80 | Temp 98.8°F | Ht 67.0 in | Wt 174.5 lb

## 2015-05-30 DIAGNOSIS — J209 Acute bronchitis, unspecified: Secondary | ICD-10-CM | POA: Diagnosis not present

## 2015-05-30 MED ORDER — BENZONATATE 200 MG PO CAPS: 200.0000 mg | ORAL_CAPSULE | Freq: Three times a day (TID) | ORAL | Status: DC | PRN

## 2015-05-30 MED ORDER — AZITHROMYCIN 250 MG PO TABS: ORAL_TABLET | ORAL | Status: DC

## 2015-05-30 NOTE — Patient Instructions (Signed)
Delsym is ok for cough  Ibuprofen and acetaminophen for fever or aches or headache or fever  Drink lots of fluids Stop the amoxicillin  Take zpak as directed  Tessalon as needed for cough   Update if not starting to improve in a week or if worsening

## 2015-05-30 NOTE — Progress Notes (Signed)
Subjective:    Patient ID: Warren Hatfield, male    DOB: Jul 09, 1966, 49 y.o.   MRN: [id number]  HPI Here with uri symptoms   Started on Thursday with a cough  Sat night - fever of 100.7 - felt lousy /chills/aches --- took tylenol and advil  Fever again Sunday night  Tired  Cough is prod of green -esp in the am  No wheezing   Took 2 tylenol at noon today   Had proph abx from dentist (amox) - started 1 pill yesterday and 1 today   Sinuses are fine / not congestion  Throat and ears are fine   ? Sick contacts   Patient Active Problem List   Diagnosis Date Noted  . Dizziness 05/10/2015  . Screening for lipoid disorders 05/10/2015  . Routine general medical examination at a health care facility 12/22/2010  . B12 deficiency 11/16/2009   Past Medical History  Diagnosis Date  . Elevated TSH   . Stress reaction   . Hip pain    Past Surgical History  Procedure Laterality Date  . Vasectomy  02/08  . Hand surgery  1998    plate nd screws in 3rd finger of right hand following fx  . [place]  . Total hip arthroplasty  02/11   Social History  Substance Use Topics  . Smoking status: Never Smoker   . Smokeless tobacco: None  . Alcohol Use: 0.0 oz/week    0 Standard drinks or equivalent per week     Comment: 1-2 drinks per month maximum   Family History  Problem Relation Age of Onset  . Hypothyroidism Mother   . Heart disease Father     MI  . Hypertension Father   . Diabetes Maternal Grandfather   . Diabetes Paternal Grandmother    No Known Allergies No current outpatient prescriptions on file prior to visit.   No current facility-administered medications on file prior to visit.      Review of Systems Review of Systems  Constitutional: Negative for  appetite change, and unexpected weight change. pos for fatigue and malaise ENT pos for post nasal drip  Eyes: Negative for pain and visual disturbance.  Respiratory: Negative for wheeze  and  shortness of breath.   Cardiovascular: Negative for cp or palpitations    Gastrointestinal: Negative for nausea, diarrhea and constipation.  Genitourinary: Negative for urgency and frequency.  Skin: Negative for pallor or rash   Neurological: Negative for weakness, light-headedness, numbness and headaches.  Hematological: Negative for adenopathy. Does not bruise/bleed easily.  Psychiatric/Behavioral: Negative for dysphoric mood. The patient is not nervous/anxious.         Objective:   Physical Exam  Constitutional: He appears well-developed and well-nourished. No distress.  HENT:  Head: Normocephalic and atraumatic.  Right Ear: External ear normal.  Left Ear: External ear normal.  Mouth/Throat: Oropharynx is clear and moist.  Nares are injected and congested  No sinus tenderness Clear rhinorrhea and post nasal drip   Eyes: Conjunctivae and EOM are normal. Pupils are equal, round, and reactive to light. Right eye exhibits no discharge. Left eye exhibits no discharge.  Neck: Normal range of motion. Neck supple.  Cardiovascular: Normal rate and normal heart sounds.   Pulmonary/Chest: Effort normal and breath sounds normal. No respiratory distress. He has no wheezes. He has no rales. He exhibits no tenderness.  Harsh bs  No wheeze Good air exch    Lymphadenopathy:  He has no cervical adenopathy.  Neurological: He is alert.  Skin: Skin is warm and dry. No rash noted.  Psychiatric: He has a normal mood and affect.          Assessment & Plan:   Problem List Items Addressed This Visit      Respiratory   Acute bronchitis - Primary    Likely viral but pt already started amoxil he had  Will cover instead with zpak (for atypical coverage)  Disc symptomatic care - see instructions on AVS  Tessalon for cough Fever control  Update if not starting to improve in a week or if worsening

## 2015-05-30 NOTE — Assessment & Plan Note (Signed)
Likely viral but pt already started amoxil he had  Will cover instead with zpak (for atypical coverage)  Disc symptomatic care - see instructions on AVS  Tessalon for cough Fever control  Update if not starting to improve in a week or if worsening

## 2015-05-30 NOTE — Progress Notes (Signed)
Pre visit review using our clinic review tool, if applicable. No additional management support is needed unless otherwise documented below in the visit note. 

## 2015-10-17 ENCOUNTER — Encounter: Payer: Self-pay | Admitting: Internal Medicine

## 2015-10-17 ENCOUNTER — Ambulatory Visit (INDEPENDENT_AMBULATORY_CARE_PROVIDER_SITE_OTHER): Payer: 59 | Admitting: Internal Medicine

## 2015-10-17 VITALS — BP 130/74 | HR 103 | Temp 99.5°F | Wt 161.0 lb

## 2015-10-17 DIAGNOSIS — A084 Viral intestinal infection, unspecified: Secondary | ICD-10-CM

## 2015-10-17 DIAGNOSIS — J020 Streptococcal pharyngitis: Secondary | ICD-10-CM

## 2015-10-17 NOTE — Patient Instructions (Signed)

## 2015-10-17 NOTE — Progress Notes (Signed)
Subjective:    Patient ID: Warren Hatfield, male    DOB: 01-Apr-1966, 50 y.o.   MRN: [id number]  HPI  Pt presents to the clinic today with c/o fatigue, nausea, vomiting and diarrhea. This started yesterday. It lasted for about 24 hours, and all of his symptoms have resolved except the fatigue. He has been running low grade fevers. He has been taking Kaopectate and Tylenol with good relief. He reports he was diagnosed with strep throat last week, and start taking Amoxicillin 1 week ago. He had not had any GI upset when he has taken Amoxicillin before. He does report that 1 day prior to the onset of his GI symptoms, his wife had nausea and vomiting for 24 hours. He does feel like his strep throat is better as well.    Review of Systems      Past Medical History  Diagnosis Date  . Elevated TSH   . Stress reaction   . Hip pain     Current Outpatient Prescriptions  Medication Sig Dispense Refill  . acetaminophen (TYLENOL) 325 MG tablet Take 650 mg by mouth every 6 (six) hours as needed.    Marland Kitchen amoxicillin (AMOXIL) 875 MG tablet     . valACYclovir (VALTREX) 500 MG tablet      No current facility-administered medications for this visit.    No Known Allergies  Family History  Problem Relation Age of Onset  . Hypothyroidism Mother   . Heart disease Father     MI  . Hypertension Father   . Diabetes Maternal Grandfather   . Diabetes Paternal Grandmother     Social History   Social History  . Marital Status: Married    Spouse Name: N/A  . Number of Children: 2  . Years of Education: N/A   Occupational History  . Policeman Unemployed   Social History Main Topics  . Smoking status: Never Smoker   . Smokeless tobacco: Not on file  . Alcohol Use: 0.0 oz/week    0 Standard drinks or equivalent per week     Comment: 1-2 drinks per month maximum  . Drug Use: No  . Sexual Activity: Not on file   Other Topics Concern  . Not on file   Social History Narrative      Constitutional: Pt reports fever and fatigue. Denies malaise, headache or abrupt weight changes.  HEENT: Denies eye pain, eye redness, ear pain, ringing in the ears, wax buildup, runny nose, nasal congestion, bloody nose, or sore throat. Respiratory: Denies difficulty breathing, shortness of breath, cough or sputum production.   Cardiovascular: Denies chest pain, chest tightness, palpitations or swelling in the hands or feet.  Gastrointestinal: Pt reports nausea, vomiting and diarrhea. Denies abdominal pain, bloating, constipation, or blood in the stool.   No other specific complaints in a complete review of systems (except as listed in HPI above).  Objective:   Physical Exam   BP 130/74 mmHg  Pulse 103  Temp(Src) 99.5 F (37.5 C) (Oral)  Wt 161 lb (73.029 kg)  SpO2 97% Wt Readings from Last 3 Encounters:  10/17/15 161 lb (73.029 kg)  05/30/15 174 lb 8 oz (79.153 kg)  05/10/15 177 lb (80.287 kg)    General: Appears his stated age, well developed, well nourished in NAD. Skin: Warm, dry and intact.  HEENT: Throat/Mouth: Teeth present, mucosa pink and moist, no exudate, lesions or ulcerations noted.  Neck:  No adenopathy noted.  Cardiovascular: Normal rate and rhythm. S1,S2 noted.  No murmur, rubs or gallops noted. No JVD or BLE edema. No carotid bruits noted. Pulmonary/Chest: Normal effort and positive vesicular breath sounds. No respiratory distress. No wheezes, rales or ronchi noted.  Abdomen: Soft and nontender. Normal bowel sounds.   BMET    Component Value Date/Time   NA 138 05/10/2015 1110   K 4.1 05/10/2015 1110   CL 105 05/10/2015 1110   CO2 28 05/10/2015 1110   GLUCOSE 96 05/10/2015 1110   BUN 14 05/10/2015 1110   CREATININE 1.00 05/10/2015 1110   CALCIUM 9.0 05/10/2015 1110   GFRNONAA >60 11/18/2009 1340   GFRAA  11/18/2009 1340    >60        The eGFR has been calculated using the MDRD equation. This calculation has not been validated in all  clinical situations. eGFR's persistently <60 mL/min signify possible Chronic Kidney Disease.    Lipid Panel     Component Value Date/Time   CHOL 165 05/10/2015 1110   TRIG 71.0 05/10/2015 1110   HDL 41.30 05/10/2015 1110   CHOLHDL 4 05/10/2015 1110   VLDL 14.2 05/10/2015 1110   LDLCALC 110* 05/10/2015 1110    CBC    Component Value Date/Time   WBC 5.6 05/10/2015 1110   RBC 4.67 05/10/2015 1110   RBC 3.00* 11/12/2009 1120   HGB 14.2 05/10/2015 1110   HCT 42.4 05/10/2015 1110   PLT 220.0 05/10/2015 1110   MCV 90.7 05/10/2015 1110   MCHC 33.6 05/10/2015 1110   RDW 12.7 05/10/2015 1110   LYMPHSABS 1.4 05/10/2015 1110   MONOABS 0.4 05/10/2015 1110   EOSABS 0.1 05/10/2015 1110   BASOSABS 0.0 05/10/2015 1110    Hgb A1C No results found for: HGBA1C       Assessment & Plan:   Viral Gastroenteritis:  I think he developed this secondary viral infection while being treated for his bacterial strep throat I do not think it is a side effect of the Amoxil He declines RX for Zofran Get some rest and drink plenty of fluids Advised good handwashing and cleaning the bathroom handles and knobs with Lysol Ibuprofen as needed for fever Work note provided to return Wednesday  Strep Throat:  Continue Amoxil until abx course is finished  RTC as needed or if symptoms persist or worsen

## 2015-11-06 ENCOUNTER — Telehealth: Payer: Self-pay | Admitting: Family Medicine

## 2015-11-06 DIAGNOSIS — Z0000 Encounter for general adult medical examination without abnormal findings: Secondary | ICD-10-CM

## 2015-11-06 NOTE — Telephone Encounter (Signed)
-----   Message from Marchia Bond sent at 11/03/2015 10:43 AM EST ----- Regarding: Cpx labs Thurs 3/2, need orders. Thanks! :-) Please order  future cpx labs for pt's upcoming lab appt. Thanks Aniceto Boss

## 2015-11-10 ENCOUNTER — Other Ambulatory Visit (INDEPENDENT_AMBULATORY_CARE_PROVIDER_SITE_OTHER): Payer: 59

## 2015-11-10 DIAGNOSIS — Z0000 Encounter for general adult medical examination without abnormal findings: Secondary | ICD-10-CM

## 2015-11-10 LAB — COMPREHENSIVE METABOLIC PANEL
ALT: 40 U/L (ref 0–53)
AST: 36 U/L (ref 0–37)
Albumin: 4.2 g/dL (ref 3.5–5.2)
Alkaline Phosphatase: 55 U/L (ref 39–117)
BILIRUBIN TOTAL: 0.7 mg/dL (ref 0.2–1.2)
BUN: 14 mg/dL (ref 6–23)
CALCIUM: 9.4 mg/dL (ref 8.4–10.5)
CHLORIDE: 105 meq/L (ref 96–112)
CO2: 30 meq/L (ref 19–32)
Creatinine, Ser: 0.98 mg/dL (ref 0.40–1.50)
GFR: 86.79 mL/min (ref >60.00)
GLUCOSE: 92 mg/dL (ref 70–99)
POTASSIUM: 4.2 meq/L (ref 3.5–5.1)
Sodium: 139 mEq/L (ref 135–145)
Total Protein: 6.6 g/dL (ref 6.0–8.3)

## 2015-11-10 LAB — CBC WITH DIFFERENTIAL/PLATELET
BASOS PCT: 0.6 % (ref 0.0–3.0)
Basophils Absolute: 0 10*3/uL (ref 0.0–0.1)
EOS PCT: 1.7 % (ref 0.0–5.0)
Eosinophils Absolute: 0.1 10*3/uL (ref 0.0–0.7)
HCT: 42 % (ref 39.0–52.0)
Hemoglobin: 14.1 g/dL (ref 13.0–17.0)
LYMPHS ABS: 1.2 10*3/uL (ref 0.7–4.0)
Lymphocytes Relative: 29.3 % (ref 12.0–46.0)
MCHC: 33.6 g/dL (ref 30.0–36.0)
MCV: 89.6 fl (ref 78.0–100.0)
MONO ABS: 0.3 10*3/uL (ref 0.1–1.0)
Monocytes Relative: 7.7 % (ref 3.0–12.0)
NEUTROS ABS: 2.6 10*3/uL (ref 1.4–7.7)
NEUTROS PCT: 60.7 % (ref 43.0–77.0)
PLATELETS: 211 10*3/uL (ref 150.0–400.0)
RBC: 4.68 Mil/uL (ref 4.22–5.81)
RDW: 12.9 % (ref 11.5–15.5)
WBC: 4.2 10*3/uL (ref 4.0–10.5)

## 2015-11-10 LAB — LIPID PANEL
CHOL/HDL RATIO: 3
Cholesterol: 160 mg/dL (ref 0–200)
HDL: 46 mg/dL (ref >39.00)
LDL Cholesterol: 106 mg/dL — ABNORMAL HIGH (ref 0–99)
NONHDL: 113.8
TRIGLYCERIDES: 38 mg/dL (ref 0.0–149.0)
VLDL: 7.6 mg/dL (ref 0.0–40.0)

## 2015-11-10 LAB — TSH: TSH: 2.4 u[IU]/mL (ref 0.35–4.50)

## 2015-11-18 ENCOUNTER — Encounter: Payer: Self-pay | Admitting: Family Medicine

## 2015-11-18 ENCOUNTER — Ambulatory Visit (INDEPENDENT_AMBULATORY_CARE_PROVIDER_SITE_OTHER): Payer: Commercial Managed Care - HMO | Admitting: Family Medicine

## 2015-11-18 VITALS — BP 112/64 | HR 60 | Temp 98.8°F | Ht 67.25 in | Wt 160.2 lb

## 2015-11-18 DIAGNOSIS — Z0000 Encounter for general adult medical examination without abnormal findings: Secondary | ICD-10-CM

## 2015-11-18 DIAGNOSIS — L309 Dermatitis, unspecified: Secondary | ICD-10-CM | POA: Insufficient documentation

## 2015-11-18 NOTE — Assessment & Plan Note (Signed)
Reviewed health habits including diet and exercise and skin cancer prevention Reviewed appropriate screening tests for age  Also reviewed health mt list, fam hx and immunization status , as well as social and family history   See HPI Labs reviewed Cholesterol is improved  Enc good diet and exercise and sun protection

## 2015-11-18 NOTE — Patient Instructions (Signed)
Take care of yourself  Labs look good Do your best to stay active and eat a healthy balanced diet  Keep using products without color or fragrance for eczema and avoid hot water Avoid fabric softener  Use a "free" detergent

## 2015-11-18 NOTE — Progress Notes (Signed)
Subjective:    Patient ID: Warren Hatfield, male    DOB: [birth year], 50 y.o.   MRN: [id number]  HPI Here for health maintenance exam and to review chronic medical problems    Feeling good overall  Works 9-5 now /but is on call    Wt is stable with bmi of 24 Lost 14 lb since the fall (was wearing vest) Thinks he has lost closer to 5 lb  Tries to take care of himself - hard to eat healthy with a 61 mo old at home  Not a lot of time for sleep    HIV screening - not interested , not high risk    Flu shot 10/16 He thinks he still got the flu - had high fever and missed 3 d of work  Better now   Td 2/08  Family hx of MI in father at young age Some DM in family  No new diabetes  No colon or prostate cancer in the family   No prostate symptoms  occ nocturia- pretty rare No flow problems    Results for orders placed or performed in visit on 11/10/15  CBC with Differential/Platelet  Result Value Ref Range   WBC 4.2 4.0 - 10.5 K/uL   RBC 4.68 4.22 - 5.81 Mil/uL   Hemoglobin 14.1 13.0 - 17.0 g/dL   HCT 42.0 39.0 - 52.0 %   MCV 89.6 78.0 - 100.0 fl   MCHC 33.6 30.0 - 36.0 g/dL   RDW 12.9 11.5 - 15.5 %   Platelets 211.0 150.0 - 400.0 K/uL   Neutrophils Relative % 60.7 43.0 - 77.0 %   Lymphocytes Relative 29.3 12.0 - 46.0 %   Monocytes Relative 7.7 3.0 - 12.0 %   Eosinophils Relative 1.7 0.0 - 5.0 %   Basophils Relative 0.6 0.0 - 3.0 %   Neutro Abs 2.6 1.4 - 7.7 K/uL   Lymphs Abs 1.2 0.7 - 4.0 K/uL   Monocytes Absolute 0.3 0.1 - 1.0 K/uL   Eosinophils Absolute 0.1 0.0 - 0.7 K/uL   Basophils Absolute 0.0 0.0 - 0.1 K/uL  Comprehensive metabolic panel  Result Value Ref Range   Sodium 139 135 - 145 mEq/L   Potassium 4.2 3.5 - 5.1 mEq/L   Chloride 105 96 - 112 mEq/L   CO2 30 19 - 32 mEq/L   Glucose, Bld 92 70 - 99 mg/dL   BUN 14 6 - 23 mg/dL   Creatinine, Ser 0.98 0.40 - 1.50 mg/dL   Total Bilirubin 0.7 0.2 - 1.2 mg/dL   Alkaline Phosphatase 55 39 - 117 U/L   AST  36 0 - 37 U/L   ALT 40 0 - 53 U/L   Total Protein 6.6 6.0 - 8.3 g/dL   Albumin 4.2 3.5 - 5.2 g/dL   Calcium 9.4 8.4 - 10.5 mg/dL   GFR 86.79 >60.00 mL/min  Lipid panel  Result Value Ref Range   Cholesterol 160 0 - 200 mg/dL   Triglycerides 38.0 0.0 - 149.0 mg/dL   HDL 46.00 >39.00 mg/dL   VLDL 7.6 0.0 - 40.0 mg/dL   LDL Cholesterol 106 (H) 0 - 99 mg/dL   Total CHOL/HDL Ratio 3    NonHDL 113.80   TSH  Result Value Ref Range   TSH 2.40 0.35 - 4.50 uIU/mL      Lab Results  Component Value Date   CHOL 160 11/10/2015   CHOL 165 05/10/2015   CHOL 174 04/08/2014   Lab Results  Component Value Date   HDL 46.00 11/10/2015   HDL 41.30 05/10/2015   HDL 45.60 04/08/2014   Lab Results  Component Value Date   LDLCALC 106* 11/10/2015   LDLCALC 110* 05/10/2015   LDLCALC 119* 04/08/2014   Lab Results  Component Value Date   TRIG 38.0 11/10/2015   TRIG 71.0 05/10/2015   TRIG 48.0 04/08/2014   Lab Results  Component Value Date   CHOLHDL 3 11/10/2015   CHOLHDL 4 05/10/2015   CHOLHDL 4 04/08/2014   No results found for: LDLDIRECT  Improved from last check Ratio is better    Patient Active Problem List   Diagnosis Date Noted  . Dizziness 05/10/2015  . Screening for lipoid disorders 05/10/2015  . Routine general medical examination at a health care facility 12/22/2010  . B12 deficiency 11/16/2009   Past Medical History  Diagnosis Date  . Elevated TSH   . Stress reaction   . Hip pain    Past Surgical History  Procedure Laterality Date  . Vasectomy  02/08  . Hand surgery  1998    plate nd screws in 3rd finger of right hand following fx  . [place]  . Total hip arthroplasty  02/11   Social History  Substance Use Topics  . Smoking status: Never Smoker   . Smokeless tobacco: None  . Alcohol Use: 0.0 oz/week    0 Standard drinks or equivalent per week     Comment: 1-2 drinks per month maximum   Family History  Problem Relation Age of Onset  .  Hypothyroidism Mother   . Heart disease Father     MI  . Hypertension Father   . Diabetes Maternal Grandfather   . Diabetes Paternal Grandmother    No Known Allergies No current outpatient prescriptions on file prior to visit.   No current facility-administered medications on file prior to visit.     Review of Systems Review of Systems  Constitutional: Negative for fever, appetite change, fatigue and unexpected weight change.  Eyes: Negative for pain and visual disturbance.  Respiratory: Negative for cough and shortness of breath.   Cardiovascular: Negative for cp or palpitations    Gastrointestinal: Negative for nausea, diarrhea and constipation.  Genitourinary: Negative for urgency and frequency.  Skin: Negative for pallor and pos for intermittent eczema (extremities)-sees derm  Neurological: Negative for weakness, light-headedness, numbness and headaches.  Hematological: Negative for adenopathy. Does not bruise/bleed easily.  Psychiatric/Behavioral: Negative for dysphoric mood. The patient is not nervous/anxious.         Objective:   Physical Exam  Constitutional: He appears well-developed and well-nourished. No distress.  Well appearing   HENT:  Head: Normocephalic and atraumatic.  Right Ear: External ear normal.  Left Ear: External ear normal.  Nose: Nose normal.  Mouth/Throat: Oropharynx is clear and moist.  Eyes: Conjunctivae and EOM are normal. Pupils are equal, round, and reactive to light. Right eye exhibits no discharge. Left eye exhibits no discharge. No scleral icterus.  Neck: Normal range of motion. Neck supple. No JVD present. Carotid bruit is not present. No thyromegaly present.  Cardiovascular: Normal rate, regular rhythm, normal heart sounds and intact distal pulses.  Exam reveals no gallop.   Pulmonary/Chest: Effort normal and breath sounds normal. No respiratory distress. He has no wheezes. He exhibits no tenderness.  Abdominal: Soft. Bowel sounds are  normal. He exhibits no distension, no abdominal bruit and no mass. There is no tenderness.  Musculoskeletal:  He exhibits no edema or tenderness.  Lymphadenopathy:    He has no cervical adenopathy.  Neurological: He is alert. He has normal reflexes. No cranial nerve deficit. He exhibits normal muscle tone. Coordination normal.  Skin: Skin is warm and dry. No rash noted. No erythema. No pallor.  Solar lentigines diffusely   Few areas of scale /eczema on back of calves (not inflammed)   Psychiatric: He has a normal mood and affect.  Pleasant and talkative           Assessment & Plan:   Problem List Items Addressed This Visit      Other   Routine general medical examination at a health care facility - Primary    Reviewed health habits including diet and exercise and skin cancer prevention Reviewed appropriate screening tests for age  Also reviewed health mt list, fam hx and immunization status , as well as social and family history   See HPI Labs reviewed Cholesterol is improved  Enc good diet and exercise and sun protection

## 2015-11-18 NOTE — Progress Notes (Signed)
Pre visit review using our clinic review tool, if applicable. No additional management support is needed unless otherwise documented below in the visit note. 

## 2016-04-26 ENCOUNTER — Ambulatory Visit (INDEPENDENT_AMBULATORY_CARE_PROVIDER_SITE_OTHER): Payer: Commercial Managed Care - HMO | Admitting: Internal Medicine

## 2016-04-26 ENCOUNTER — Encounter: Payer: Self-pay | Admitting: Internal Medicine

## 2016-04-26 VITALS — BP 118/70 | HR 75 | Temp 98.1°F | Wt 169.5 lb

## 2016-04-26 DIAGNOSIS — L720 Epidermal cyst: Secondary | ICD-10-CM

## 2016-04-26 NOTE — Progress Notes (Signed)
Subjective:    Patient ID: Warren Hatfield, male    DOB: Feb 21, 1966, 50 y.o.   MRN: [id number]  HPI  Pt presents to the clinic today with c/o a lump on his right lower shin. He noticed this 5 days ago. He has not noticed that it has gotten bigger in size. It is not tender or painful. He denies redness or warmth. He denies any injury to the area. He has not tried anything OTC for this.   Review of Systems      Past Medical History:  Diagnosis Date  . Elevated TSH   . Hip pain   . Stress reaction     No current outpatient prescriptions on file.   No current facility-administered medications for this visit.     Allergies  Allergen Reactions  . Oxycodone-Acetaminophen Nausea Only    Family History  Problem Relation Age of Onset  . Hypothyroidism Mother   . Heart disease Father     MI  . Hypertension Father   . Diabetes Maternal Grandfather   . Diabetes Paternal Grandmother     Social History   Social History  . Marital status: Married    Spouse name: N/A  . Number of children: 2  . Years of education: N/A   Occupational History  . Policeman Unemployed   Social History Main Topics  . Smoking status: Never Smoker  . Smokeless tobacco: Not on file  . Alcohol use 0.0 oz/week     Comment: 1-2 drinks per month maximum  . Drug use: No  . Sexual activity: Not on file   Other Topics Concern  . Not on file   Social History Narrative  . No narrative on file     Constitutional: Denies fever, malaise, fatigue, headache or abrupt weight changes.  Musculoskeletal: Denies decrease in range of motion, difficulty with gait, muscle pain or joint pain and swelling.  Skin: Pt reports lump on right shin. Denies redness, rashes, lesions or ulcercations.    No other specific complaints in a complete review of systems (except as listed in HPI above).  Objective:   Physical Exam  BP 118/70   Pulse 75   Temp 98.1 F (36.7 C) (Oral)   Wt 169 lb 8 oz (76.9 kg)    SpO2 97%   BMI 26.35 kg/m  Wt Readings from Last 3 Encounters:  04/26/16 169 lb 8 oz (76.9 kg)  11/18/15 160 lb 4 oz (72.7 kg)  10/17/15 161 lb (73 kg)    General: Appears his stated age, well developed, well nourished in NAD. Skin: Warm, dry and intact. 1 cm round mobile epidermal cyst noted of right shin.  BMET    Component Value Date/Time   NA 139 11/10/2015 0930   K 4.2 11/10/2015 0930   CL 105 11/10/2015 0930   CO2 30 11/10/2015 0930   GLUCOSE 92 11/10/2015 0930   BUN 14 11/10/2015 0930   CREATININE 0.98 11/10/2015 0930   CALCIUM 9.4 11/10/2015 0930   GFRNONAA >60 11/18/2009 1340   GFRAA  11/18/2009 1340    >60        The eGFR has been calculated using the MDRD equation. This calculation has not been validated in all clinical situations. eGFR's persistently <60 mL/min signify possible Chronic Kidney Disease.    Lipid Panel     Component Value Date/Time   CHOL 160 11/10/2015 0930   TRIG 38.0 11/10/2015 0930   HDL 46.00 11/10/2015 0930   CHOLHDL 3  11/10/2015 0930   VLDL 7.6 11/10/2015 0930   LDLCALC 106 (H) 11/10/2015 0930    CBC    Component Value Date/Time   WBC 4.2 11/10/2015 0930   RBC 4.68 11/10/2015 0930   HGB 14.1 11/10/2015 0930   HCT 42.0 11/10/2015 0930   PLT 211.0 11/10/2015 0930   MCV 89.6 11/10/2015 0930   MCHC 33.6 11/10/2015 0930   RDW 12.9 11/10/2015 0930   LYMPHSABS 1.2 11/10/2015 0930   MONOABS 0.3 11/10/2015 0930   EOSABS 0.1 11/10/2015 0930   BASOSABS 0.0 11/10/2015 0930    Hgb A1C No results found for: HGBA1C          Assessment & Plan:   Epidermal Cyst of right leg:  Benign No intervention needed at this time Will monitor for now Return precautions given  RTC as needed or if symptoms persist or worsen Kiana Hollar, NP

## 2016-04-26 NOTE — Progress Notes (Signed)
Pre visit review using our clinic review tool, if applicable. No additional management support is needed unless otherwise documented below in the visit note. 

## 2016-04-26 NOTE — Patient Instructions (Signed)
Epidermal Cyst An epidermal cyst is sometimes called a sebaceous cyst, epidermal inclusion cyst, or infundibular cyst. These cysts usually contain a substance that looks "pasty" or "cheesy" and may have a bad smell. This substance is a protein called keratin. Epidermal cysts are usually found on the face, neck, or trunk. They may also occur in the vaginal area or other parts of the genitalia of both men and women. Epidermal cysts are usually small, painless, slow-growing bumps or lumps that move freely under the skin. It is important not to try to pop them. This may cause an infection and lead to tenderness and swelling. CAUSES  Epidermal cysts may be caused by a deep penetrating injury to the skin or a plugged hair follicle, often associated with acne. SYMPTOMS  Epidermal cysts can become inflamed and cause:  Redness.  Tenderness.  Increased temperature of the skin over the bumps or lumps.  Grayish-white, bad smelling material that drains from the bump or lump. DIAGNOSIS  Epidermal cysts are easily diagnosed by your caregiver during an exam. Rarely, a tissue sample (biopsy) may be taken to rule out other conditions that may resemble epidermal cysts. TREATMENT   Epidermal cysts often get better and disappear on their own. They are rarely ever cancerous.  If a cyst becomes infected, it may become inflamed and tender. This may require opening and draining the cyst. Treatment with antibiotics may be necessary. When the infection is gone, the cyst may be removed with minor surgery.  Small, inflamed cysts can often be treated with antibiotics or by injecting steroid medicines.  Sometimes, epidermal cysts become large and bothersome. If this happens, surgical removal in your caregiver's office may be necessary. HOME CARE INSTRUCTIONS  Only take over-the-counter or prescription medicines as directed by your caregiver.  Take your antibiotics as directed. Finish them even if you start to feel  better. SEEK MEDICAL CARE IF:   Your cyst becomes tender, red, or swollen.  Your condition is not improving or is getting worse.  You have any other questions or concerns. MAKE SURE YOU:  Understand these instructions.  Will watch your condition.  Will get help right away if you are not doing well or get worse.   This information is not intended to replace advice given to you by your health care provider. Make sure you discuss any questions you have with your health care provider.   Document Released: 07/28/2004 Document Revised: 11/19/2011 Document Reviewed: 03/05/2011 Elsevier Interactive Patient Education 2016 [employer]  

## 2017-01-02 DIAGNOSIS — H35412 Lattice degeneration of retina, left eye: Secondary | ICD-10-CM | POA: Diagnosis not present

## 2017-01-02 DIAGNOSIS — H17823 Peripheral opacity of cornea, bilateral: Secondary | ICD-10-CM | POA: Diagnosis not present

## 2017-01-02 DIAGNOSIS — H2513 Age-related nuclear cataract, bilateral: Secondary | ICD-10-CM | POA: Diagnosis not present

## 2017-03-17 ENCOUNTER — Telehealth: Payer: Self-pay | Admitting: Family Medicine

## 2017-03-17 DIAGNOSIS — E538 Deficiency of other specified B group vitamins: Secondary | ICD-10-CM

## 2017-03-17 DIAGNOSIS — Z125 Encounter for screening for malignant neoplasm of prostate: Secondary | ICD-10-CM

## 2017-03-17 DIAGNOSIS — Z0000 Encounter for general adult medical examination without abnormal findings: Secondary | ICD-10-CM

## 2017-03-17 NOTE — Telephone Encounter (Signed)
-----   Message from Ellamae Sia sent at 03/15/2017  9:34 AM EDT ----- Regarding: Lab orders for Tuesday, 7.10.18 Patient is scheduled for CPX labs, please order future labs, Thanks , Karna Christmas

## 2017-03-19 ENCOUNTER — Other Ambulatory Visit (INDEPENDENT_AMBULATORY_CARE_PROVIDER_SITE_OTHER): Payer: 59

## 2017-03-19 DIAGNOSIS — Z125 Encounter for screening for malignant neoplasm of prostate: Secondary | ICD-10-CM | POA: Diagnosis not present

## 2017-03-19 DIAGNOSIS — Z0000 Encounter for general adult medical examination without abnormal findings: Secondary | ICD-10-CM | POA: Diagnosis not present

## 2017-03-19 DIAGNOSIS — E538 Deficiency of other specified B group vitamins: Secondary | ICD-10-CM | POA: Diagnosis not present

## 2017-03-19 LAB — CBC WITH DIFFERENTIAL/PLATELET
BASOS PCT: 0.6 % (ref 0.0–3.0)
Basophils Absolute: 0 10*3/uL (ref 0.0–0.1)
Eosinophils Absolute: 0.2 10*3/uL (ref 0.0–0.7)
Eosinophils Relative: 3.5 % (ref 0.0–5.0)
HCT: 40.5 % (ref 39.0–52.0)
Hemoglobin: 13.9 g/dL (ref 13.0–17.0)
LYMPHS ABS: 1.7 10*3/uL (ref 0.7–4.0)
Lymphocytes Relative: 26 % (ref 12.0–46.0)
MCHC: 34.2 g/dL (ref 30.0–36.0)
MCV: 89 fl (ref 78.0–100.0)
MONO ABS: 0.6 10*3/uL (ref 0.1–1.0)
Monocytes Relative: 9.1 % (ref 3.0–12.0)
NEUTROS ABS: 3.9 10*3/uL (ref 1.4–7.7)
NEUTROS PCT: 60.8 % (ref 43.0–77.0)
PLATELETS: 242 10*3/uL (ref 150.0–400.0)
RBC: 4.55 Mil/uL (ref 4.22–5.81)
RDW: 12.8 % (ref 11.5–15.5)
WBC: 6.4 10*3/uL (ref 4.0–10.5)

## 2017-03-19 LAB — LIPID PANEL
Cholesterol: 168 mg/dL (ref 0–200)
HDL: 48.5 mg/dL (ref >39.00)
LDL CALC: 109 mg/dL — AB (ref 0–99)
NONHDL: 119.28
Total CHOL/HDL Ratio: 3
Triglycerides: 50 mg/dL (ref 0.0–149.0)
VLDL: 10 mg/dL (ref 0.0–40.0)

## 2017-03-19 LAB — COMPREHENSIVE METABOLIC PANEL
ALK PHOS: 61 U/L (ref 39–117)
ALT: 25 U/L (ref 0–53)
AST: 24 U/L (ref 0–37)
Albumin: 4 g/dL (ref 3.5–5.2)
BUN: 16 mg/dL (ref 6–23)
CHLORIDE: 106 meq/L (ref 96–112)
CO2: 28 mEq/L (ref 19–32)
Calcium: 9.3 mg/dL (ref 8.4–10.5)
Creatinine, Ser: 0.95 mg/dL (ref 0.40–1.50)
GFR: 89.45 mL/min (ref >60.00)
GLUCOSE: 98 mg/dL (ref 70–99)
POTASSIUM: 4.2 meq/L (ref 3.5–5.1)
SODIUM: 141 meq/L (ref 135–145)
TOTAL PROTEIN: 6.2 g/dL (ref 6.0–8.3)
Total Bilirubin: 0.6 mg/dL (ref 0.2–1.2)

## 2017-03-19 LAB — VITAMIN B12: Vitamin B-12: 390 pg/mL (ref 211–911)

## 2017-03-19 LAB — TSH: TSH: 4.56 u[IU]/mL — ABNORMAL HIGH (ref 0.35–4.50)

## 2017-03-19 LAB — PSA: PSA: 0.47 ng/mL (ref 0.10–4.00)

## 2017-03-25 ENCOUNTER — Encounter: Payer: Self-pay | Admitting: Family Medicine

## 2017-03-25 ENCOUNTER — Ambulatory Visit (INDEPENDENT_AMBULATORY_CARE_PROVIDER_SITE_OTHER): Payer: 59 | Admitting: Family Medicine

## 2017-03-25 VITALS — BP 126/68 | HR 71 | Temp 98.1°F | Ht 67.0 in | Wt 176.5 lb

## 2017-03-25 DIAGNOSIS — E538 Deficiency of other specified B group vitamins: Secondary | ICD-10-CM

## 2017-03-25 DIAGNOSIS — N529 Male erectile dysfunction, unspecified: Secondary | ICD-10-CM | POA: Insufficient documentation

## 2017-03-25 DIAGNOSIS — E038 Other specified hypothyroidism: Secondary | ICD-10-CM | POA: Insufficient documentation

## 2017-03-25 DIAGNOSIS — Z23 Encounter for immunization: Secondary | ICD-10-CM

## 2017-03-25 DIAGNOSIS — Z0000 Encounter for general adult medical examination without abnormal findings: Secondary | ICD-10-CM

## 2017-03-25 DIAGNOSIS — Z125 Encounter for screening for malignant neoplasm of prostate: Secondary | ICD-10-CM | POA: Diagnosis not present

## 2017-03-25 DIAGNOSIS — R7989 Other specified abnormal findings of blood chemistry: Secondary | ICD-10-CM

## 2017-03-25 DIAGNOSIS — R946 Abnormal results of thyroid function studies: Secondary | ICD-10-CM | POA: Diagnosis not present

## 2017-03-25 DIAGNOSIS — E039 Hypothyroidism, unspecified: Secondary | ICD-10-CM | POA: Insufficient documentation

## 2017-03-25 MED ORDER — SILDENAFIL CITRATE 50 MG PO TABS: 50.0000 mg | ORAL_TABLET | Freq: Every day | ORAL | 3 refills | Status: DC | PRN

## 2017-03-25 NOTE — Assessment & Plan Note (Signed)
New with fam hx of hypothyroidism Subclinical  Nl exam  Re check in 3 mo with thyroid prof

## 2017-03-25 NOTE — Assessment & Plan Note (Signed)
Lab Results  Component Value Date   PSA 0.47 03/19/2017   Reassuring No symptoms or family hx  Continue to monitor

## 2017-03-25 NOTE — Patient Instructions (Addendum)
Try to fit in some more routine exercise   For cholesterol:   Avoid red meat/ fried foods/ egg yolks/ fatty breakfast meats/ butter, cheese and high fat dairy/ and shellfish    You may be developing hypothyroidism Let's re check some thyroid labs in about 3 months   Try the generic viagra- update [place] if side effects or if it does not work   Tdap vaccine today

## 2017-03-25 NOTE — Assessment & Plan Note (Signed)
Stable with current supplementation  Lab Results  Component Value Date   VITAMINB12 390 03/19/2017

## 2017-03-25 NOTE — Progress Notes (Signed)
Subjective:    Patient ID: Warren Hatfield, male    DOB: 1966-01-22, 51 y.o.   MRN: [id number]  HPI Here for health maintenance exam and to review chronic medical problems    Doing well  Working a lot  May take some time off this summer   Wt Readings from Last 3 Encounters:  03/25/17 176 lb 8 oz (80.1 kg)  04/26/16 169 lb 8 oz (76.9 kg)  11/18/15 160 lb 4 oz (72.7 kg)  eating healthy overall  Exercise -not as much as he wants - yard work included / 2-3 d per week  Hard to with a new baby (2 yo in [place]) 27.64 kg/m     Prostate screen No prostate cancer in family  Lab Results  Component Value Date   PSA 0.47 03/19/2017   Baseline No nocturia as a rule  (occ once)  No problems with flow or stream   Some erectile problems  Trouble getting and keeping an erection  Drive is there / but no energy to back it up    Tetanus shot 2/08 Due for that   Hx of B12 def Lab Results  Component Value Date   VITAMINB12 390 03/19/2017  this is stable   BP Readings from Last 3 Encounters:  03/25/17 126/68  04/26/16 118/70  11/18/15 112/64   Pulse Readings from Last 3 Encounters:  03/25/17 71  04/26/16 75  11/18/15 60    TSH is slt elevated Lab Results  Component Value Date   TSH 4.56 (H) 03/19/2017   fam hx of thyroid problems   Sister - had thyroid problems Mother -hypothyroid   He is tired/sluggish (but has a 49 yo)  He has dry skin- eczema - that is better  No hair loss     Chemistry      Component Value Date/Time   NA 141 03/19/2017 0818   K 4.2 03/19/2017 0818   CL 106 03/19/2017 0818   CO2 28 03/19/2017 0818   BUN 16 03/19/2017 0818   CREATININE 0.95 03/19/2017 0818      Component Value Date/Time   CALCIUM 9.3 03/19/2017 0818   ALKPHOS 61 03/19/2017 0818   AST 24 03/19/2017 0818   ALT 25 03/19/2017 0818   BILITOT 0.6 03/19/2017 0818     glucose 98 Lab Results  Component Value Date   WBC 6.4 03/19/2017   HGB 13.9 03/19/2017   HCT 40.5  03/19/2017   MCV 89.0 03/19/2017   PLT 242.0 03/19/2017    Cholesterol  Lab Results  Component Value Date   CHOL 168 03/19/2017   CHOL 160 11/10/2015   CHOL 165 05/10/2015   Lab Results  Component Value Date   HDL 48.50 03/19/2017   HDL 46.00 11/10/2015   HDL 41.30 05/10/2015   Lab Results  Component Value Date   LDLCALC 109 (H) 03/19/2017   LDLCALC 106 (H) 11/10/2015   LDLCALC 110 (H) 05/10/2015   Lab Results  Component Value Date   TRIG 50.0 03/19/2017   TRIG 38.0 11/10/2015   TRIG 71.0 05/10/2015   Lab Results  Component Value Date   CHOLHDL 3 03/19/2017   CHOLHDL 3 11/10/2015   CHOLHDL 4 05/10/2015   No results found for: LDLDIRECT Good profile / no change   Review of Systems Review of Systems  Constitutional: Negative for fever, appetite change, fatigue and unexpected weight change.  Eyes: Negative for pain and visual disturbance.  Respiratory: Negative for cough and shortness of  breath.   Cardiovascular: Negative for cp or palpitations    Gastrointestinal: Negative for nausea, diarrhea and constipation.  Genitourinary: Negative for urgency and frequency. pos for ED symptoms  Skin: Negative for pallor or rash   MSK pos for sore joints in L hand 3/4th fingers (does have old injuries) Neurological: Negative for weakness, light-headedness, numbness and headaches.  Hematological: Negative for adenopathy. Does not bruise/bleed easily.  Psychiatric/Behavioral: Negative for dysphoric mood. The patient is not nervous/anxious.         Objective:   Physical Exam  Constitutional: He appears well-developed and well-nourished. No distress.  Well appearing   HENT:  Head: Normocephalic and atraumatic.  Right Ear: External ear normal.  Left Ear: External ear normal.  Nose: Nose normal.  Mouth/Throat: Oropharynx is clear and moist.  Eyes: Pupils are equal, round, and reactive to light. Conjunctivae and EOM are normal. Right eye exhibits no discharge. Left eye  exhibits no discharge. No scleral icterus.  Neck: Normal range of motion. Neck supple. No JVD present. Carotid bruit is not present. No thyromegaly present.  Cardiovascular: Normal rate, regular rhythm, normal heart sounds and intact distal pulses.  Exam reveals no gallop.   Pulmonary/Chest: Effort normal and breath sounds normal. No respiratory distress. He has no wheezes. He exhibits no tenderness.  Abdominal: Soft. Bowel sounds are normal. He exhibits no distension, no abdominal bruit and no mass. There is no tenderness.  Musculoskeletal: He exhibits no edema or tenderness.  Lymphadenopathy:    He has no cervical adenopathy.  Neurological: He is alert. He has normal reflexes. No cranial nerve deficit. He exhibits normal muscle tone. Coordination normal.  Skin: Skin is warm and dry. No rash noted. No erythema. No pallor.  Solar lentigines diffusely   No eczema today  Psychiatric: He has a normal mood and affect.  Talkative and cheerful           Assessment & Plan:   Problem List Items Addressed This Visit      Genitourinary   Erectile dysfunction    Trial of viagra 50 mg  Disc poss side effects -will update  Suspect fatigue is part of his problem -may improve as kids get older and he can sleep at night  No hx of vascular dz or depression          Other   B12 deficiency    Stable with current supplementation  Lab Results  Component Value Date   VITAMINB12 390 03/19/2017          Elevated TSH    New with fam hx of hypothyroidism Subclinical  Nl exam  Re check in 3 mo with thyroid prof      Relevant Orders   TSH   T4, free   T3 Uptake   Prostate cancer screening    Lab Results  Component Value Date   PSA 0.47 03/19/2017   Reassuring No symptoms or family hx  Continue to monitor       Routine general medical examination at a health care facility - Primary    Reviewed health habits including diet and exercise and skin cancer prevention Reviewed  appropriate screening tests for age  Also reviewed health mt list, fam hx and immunization status , as well as social and family history   See HPI Labs reviewed  Tdap updated  TSH slightly elevated (subclinical) -re check 3 mo with thyroid profile      Relevant Orders   Tdap vaccine greater than or equal  to 7yo IM (Completed)    Other Visit Diagnoses    Need for Tdap vaccination       Relevant Orders   Tdap vaccine greater than or equal to 7yo IM (Completed)

## 2017-03-25 NOTE — Assessment & Plan Note (Signed)
Trial of viagra 50 mg  Disc poss side effects -will update  Suspect fatigue is part of his problem -may improve as kids get older and he can sleep at night  No hx of vascular dz or depression

## 2017-03-25 NOTE — Assessment & Plan Note (Signed)
Reviewed health habits including diet and exercise and skin cancer prevention Reviewed appropriate screening tests for age  Also reviewed health mt list, fam hx and immunization status , as well as social and family history   See HPI Labs reviewed  Tdap updated  TSH slightly elevated (subclinical) -re check 3 mo with thyroid profile

## 2017-06-25 ENCOUNTER — Other Ambulatory Visit (INDEPENDENT_AMBULATORY_CARE_PROVIDER_SITE_OTHER): Payer: 59

## 2017-06-25 DIAGNOSIS — R7989 Other specified abnormal findings of blood chemistry: Secondary | ICD-10-CM

## 2017-06-25 LAB — TSH: TSH: 4.54 u[IU]/mL — ABNORMAL HIGH (ref 0.35–4.50)

## 2017-06-25 LAB — T4, FREE: Free T4: 0.89 ng/dL (ref 0.60–1.60)

## 2017-06-27 LAB — T3 UPTAKE: T3 Uptake: 28 % (ref 22–35)

## 2017-07-03 ENCOUNTER — Other Ambulatory Visit: Payer: Self-pay | Admitting: Occupational Medicine

## 2017-07-03 ENCOUNTER — Ambulatory Visit
Admission: RE | Admit: 2017-07-03 | Discharge: 2017-07-03 | Disposition: A | Discharge status: Home / Self Care | Payer: No Typology Code available for payment source | Source: Ambulatory Visit | Attending: Occupational Medicine | Admitting: Occupational Medicine

## 2017-07-03 DIAGNOSIS — Z0000 Encounter for general adult medical examination without abnormal findings: Secondary | ICD-10-CM

## 2017-10-29 DIAGNOSIS — L308 Other specified dermatitis: Secondary | ICD-10-CM | POA: Diagnosis not present

## 2017-10-29 DIAGNOSIS — B078 Other viral warts: Secondary | ICD-10-CM | POA: Diagnosis not present

## 2017-10-29 DIAGNOSIS — B070 Plantar wart: Secondary | ICD-10-CM | POA: Diagnosis not present

## 2017-11-27 ENCOUNTER — Ambulatory Visit: Payer: 59 | Admitting: Podiatry

## 2017-11-27 ENCOUNTER — Encounter: Payer: Self-pay | Admitting: Podiatry

## 2017-11-27 VITALS — BP 113/66 | HR 63 | Resp 16

## 2017-11-27 DIAGNOSIS — L600 Ingrowing nail: Secondary | ICD-10-CM

## 2017-11-27 NOTE — Patient Instructions (Addendum)
Betadine Soak Instructions  Purchase an 8 oz. bottle of BETADINE solution (Povidone)  THE DAY AFTER THE PROCEDURE  Place 1 tablespoon of betadine solution in a quart of warm tap water.  Submerge your foot or feet with outer bandage intact for the initial soak; this will allow the bandage to become moist and wet for easy lift off.  Once you remove your bandage, continue to soak in the solution for 20 minutes.  This soak should be done twice a day.  Next, remove your foot or feet from solution, blot dry the affected area and cover.  You may use a band aid large enough to cover the area or use gauze and tape.  Apply other medications to the area as directed by the doctor such as cortisporin otic solution (ear drops) or neosporin.  IF YOUR SKIN BECOMES IRRITATED WHILE USING THESE INSTRUCTIONS, IT IS OKAY TO SWITCH TO EPSOM SALTS AND WATER OR WHITE VINEGAR AND WATER.   Soak Instructions    THE DAY AFTER THE PROCEDURE  Place 1/4 cup of epsom salts in a quart of warm tap water.  Submerge your foot or feet with outer bandage intact for the initial soak; this will allow the bandage to become moist and wet for easy lift off.  Once you remove your bandage, continue to soak in the solution for 20 minutes.  This soak should be done twice a day.  Next, remove your foot or feet from solution, blot dry the affected area and cover.  You may use a band aid large enough to cover the area or use gauze and tape.  Apply other medications to the area as directed by the doctor such as polysporin neosporin.  IF YOUR SKIN BECOMES IRRITATED WHILE USING THESE INSTRUCTIONS, IT IS OKAY TO SWITCH TO  WHITE VINEGAR AND WATER. Or you may use antibacterial soap and water to keep the toe clean  Monitor for any signs/symptoms of infection. Call the office immediately if any occur or go directly to the emergency room. Call with any questions/concerns.    [place] Instructions-Post Nail Surgery  You have had your ingrown  toenail and root treated with a chemical.  This chemical causes a burn that will drain and ooze like a blister.  This can drain for 6-8 weeks or longer.  It is important to keep this area clean, covered, and follow the soaking instructions dispensed at the time of your surgery.  This area will eventually dry and form a scab.  Once the scab forms you no longer need to soak or apply a dressing.  If at any time you experience an increase in pain, redness, swelling, or drainage, you should contact the office as soon as possible.

## 2017-11-27 NOTE — Progress Notes (Signed)
Subjective:   Patient ID: Warren Hatfield, male   DOB: 52 y.o.   MRN: [id number]   HPI Patient presents stating he has discoloration of the big toenails bilateral and on the right nail there is a ingrown toenail on the medial side it hard for him to cut and he is tried to trim it and soak it and it remains painful.  States is been going on for around 6 months   Review of Systems  All other systems reviewed and are negative.       Objective:  Physical Exam  Constitutional: He appears well-developed and well-nourished.  Cardiovascular: Intact distal pulses.  Pulmonary/Chest: Effort normal.  Musculoskeletal: Normal range of motion.  Neurological: He is alert.  Skin: Skin is warm.  Nursing note and vitals reviewed.   Neurovascular status found to be intact muscle strength was adequate range of motion was noted to be within normal limits.  Patient is found to have discoloration hallux nails bilateral with moderate thickness and on the right hallux medial border incurvated ingrown in the corner with no redness or drainage but pain with palpation.  Patient has good digital perfusion and is well oriented x3     Assessment:  Probability for trauma of the hallux nailbeds with discoloration and thickness with ingrown toenail deformity right hallux medial border     Plan:  H&P conditions reviewed and do not recommend treatment for the nails as I do think trauma is the problem.  The ingrown needs to be corrected due to pain and patient wants surgery understanding risk and today I went ahead and I explained the procedure and have him signed consent form.  I then infiltrated the right hallux 60 mg like Marcaine mixture remove the medial border exposed matrix and applied phenol 3 applications 30 seconds followed by alcohol lavage sterile dressing.  All of this was done under sterile technique after sterile prep of the toe.  I instructed on soaks and reappoint

## 2018-01-17 DIAGNOSIS — H35412 Lattice degeneration of retina, left eye: Secondary | ICD-10-CM | POA: Diagnosis not present

## 2018-01-17 DIAGNOSIS — D4981 Neoplasm of unspecified behavior of retina and choroid: Secondary | ICD-10-CM | POA: Diagnosis not present

## 2018-01-17 DIAGNOSIS — H17823 Peripheral opacity of cornea, bilateral: Secondary | ICD-10-CM | POA: Diagnosis not present

## 2018-01-30 NOTE — Progress Notes (Signed)
[facility] Note  01/31/2018     CHIEF COMPLAINT Patient presents for Retina Evaluation   HISTORY OF PRESENT ILLNESS: Warren Hatfield is a 52 y.o. male who presents to the clinic today for:   HPI    Retina Evaluation    In both eyes.  This started 1 year ago.  Associated Symptoms Negative for Blind Spot, Glare, Shoulder/Hip pain, Fatigue, Jaw Claudication, Photophobia, Distortion, Floaters, Redness, Scalp Tenderness, Weight Loss, Fever, Trauma, Pain and Flashes.  Context:  distance vision, mid-range vision and near vision.  Treatments tried include surgery.  Response to treatment was significant improvement.  I, the attending physician,  performed the HPI with the patient and updated documentation appropriately.          Comments    Referral of DR. Parker for retina evaluation., Possible Chordal nevus OD. Patient states he has not had any visual issues, "Dr Jerline Pain told him he had a frickle right eye and he needed it to be checked". Pt reports he had [place]. Denies wavy vision, ocular pain and flashes. Denies Vitt's/gtt's        Last edited by Bernarda Caffey, MD on 02/04/2018  4:56 PM. (History)    Pt is a referral from Dr. Jerline Pain who he is a regular pt of, pt last saw her for routine eye exam, pt has had ALK for near sightedness in 1993 performed by Dr. 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 Shawnie Dapper for concern of Choroidal Nevus OD LEE: 05.10.19 Clent Jacks) [BCVA: OD: 20/20 OS: 20/20] Ocular Hx-Corneal Opacity OU, Cataract OU, Choroidal Nevus OD, Lattice Degeneration OS, [place] Degeneration OD, Irregular Astigmatism, s/p laser ALK OU (Dr. [id number]) PMH-    CURRENT  MEDICATIONS: No current outpatient medications on file. (Ophthalmic Drugs)   No current facility-administered medications for this visit.  (Ophthalmic Drugs)   Current Outpatient Medications (Other)  Medication Sig  . sildenafil (VIAGRA) 50 MG tablet Take 1 tablet (50 mg total) by mouth daily as needed for erectile dysfunction. Take 30 minutes before sexual activity   No current facility-administered medications for this visit.  (Other)      REVIEW OF SYSTEMS: ROS    Positive for: Musculoskeletal, Eyes   Negative for: Constitutional, Gastrointestinal, Neurological, Skin, Genitourinary, HENT, Endocrine, Cardiovascular, Respiratory, Psychiatric, Allergic/Imm, Heme/Lymph   Last edited by Zenovia Jordan, LPN on 1/47/8295  6:21 AM. (History)       ALLERGIES Allergies  Allergen Reactions  . Oxycodone-Acetaminophen Nausea Only  . Sildenafil     Pt stated, "It gave me a headache"    PAST MEDICAL HISTORY Past Medical History:  Diagnosis Date  . Elevated TSH   . Hip pain   . Stress reaction    Past Surgical History:  Procedure Laterality Date  . HAND SURGERY  1998   plate nd screws in 3rd finger of right hand following fx  . LASIK  1993   Both eyes  . TOTAL HIP ARTHROPLASTY  02/11  . VASECTOMY  02/08    FAMILY HISTORY Family History  Problem Relation Age of Onset  . Heart disease Father        MI  . Hypertension Father   .  Hypothyroidism Mother   . Diabetes Maternal Grandfather   . Diabetes Paternal Grandmother     SOCIAL HISTORY Social History   Tobacco Use  . Smoking status: Never Smoker  . Smokeless tobacco: Current User  Substance Use Topics  . Alcohol use: Yes    Alcohol/week: 0.0 oz    Comment: 1-2 drinks per month maximum  . Drug use: No         OPHTHALMIC EXAM:  Base Eye Exam    Visual Acuity (Snellen - Linear)      Right Left   Dist cc 20/20 20/20 -1       Tonometry (Tonopen, 9:10 AM)      Right Left   Pressure 19 16        Pupils      Dark Light Shape React APD   Right 4 3 Round Slow None   Left 4 3 Round Slow None       Visual Fields (Counting fingers)      Left Right    Full Full       Extraocular Movement      Right Left    Full, Ortho Full, Ortho       Neuro/Psych    Oriented x3:  Yes   Mood/Affect:  Normal       Dilation    Both eyes:  1.0% Mydriacyl, 2.5% Phenylephrine @ 9:10 AM        Slit Lamp and Fundus Exam    Slit Lamp Exam      Right Left   Lids/Lashes mild Dermatochalasis mild Dermatochalasis, mild , Meibomian gland dysfunction   Conjunctiva/Sclera White and quiet White and quiet   Cornea ALK scar, ABMD clear, mild ALK scar   Anterior Chamber Deep, No cell or flare Deep, No cell or flare   Iris Round and dilated Round and dilated   Lens 2+ Cortical cataract, 1+ Nuclear sclerosis, Pigment deposits near anterior chamber 2+ Cortical cataract, 2+ Nuclear sclerosis, Pigment deposits   Vitreous mild Vitreous syneresis mild Vitreous syneresis       Fundus Exam      Right Left   Disc Pink and Sharp Pink and Sharp   C/D Ratio 0.4 0.4   Macula Good foveal reflex, No heme or edema Good foveal reflex, No heme or edema   Vessels Normal Normal   Periphery Attached, mild Lattice degeneration IN, cobblestoning at 0900, irregular Choroidal nevus 0600, pigmented lattice at 1200, small, punctate, atrophic hole overlying nevus at 0700 Attached, pigmented Lattice degeneration with atrophic holes 1200 midzone, another patch from 0100-0130, 0430-0500, cobblestoning at 0430        Refraction    Wearing Rx      Sphere Cylinder Axis Add   Right -1.75 +1.25 015 +2.00   Left -4.50 +2.25 016 +2.00   Age:  1 wk   Type:  PAL       Manifest Refraction (Auto)      Sphere Cylinder Axis Dist VA   Right -2.00 +2.50 020 20/20   Left -5.25 +3.25 024 20/25-1          IMAGING AND PROCEDURES  Imaging and Procedures for _0 @  OCT, Retina - OU - Both Eyes       Right Eye Quality was good.  Central Foveal Thickness: 331. Progression has no prior data. Findings include normal foveal contour, no SRF, no IRF.   Left Eye Central Foveal Thickness: 331. Progression has no prior data. Findings include normal  foveal contour, no SRF, no IRF.   Notes *Images captured and stored on drive  Diagnosis / Impression:  NFP, no IRF/SRF OU  Clinical management:  See below  Abbreviations: NFP - Normal foveal profile. CME - cystoid macular edema. PED - pigment epithelial detachment. IRF - intraretinal fluid. SRF - subretinal fluid. EZ - ellipsoid zone. ERM - epiretinal membrane. ORA - outer retinal atrophy. ORT - outer retinal tubulation. SRHM - subretinal hyper-reflective material        Color Fundus Photography Optos - OU - Both Eyes       Right Eye Progression has no prior data. Disc findings include normal observations. Macula : normal observations. Vessels : normal observations. Periphery : lattice, hole, RPE abnormality (Pigmented nevus inferior periphery).   Left Eye Progression has no prior data. Disc findings include normal observations. Macula : normal observations. Vessels : normal observations. Periphery : normal observations, lattice, hole.                 ASSESSMENT/PLAN:    ICD-10-CM   1. Retinal hole of right eye H33.321 Color Fundus Photography Optos - OU - Both Eyes  2. Choroidal nevus of right eye D31.31 Color Fundus Photography Optos - OU - Both Eyes  3. Lattice degeneration of both retinas H35.413 Color Fundus Photography Optos - OU - Both Eyes  4. Retinal edema H35.81 OCT, Retina - OU - Both Eyes  5. 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 Myopia of both eyes with astigmatism H52.13    H52.203   7. History of automated lamellar keratoplasty Z94.7   8. 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 Choroidal nevus with small, round atrophic retinal hole overlying, OD - flat irregular pigmented nevus at 0600 - no  SRF, orange pigment or drusen associated - small atrophic round hole overlying -- no SRF - recommend laser retinopexy along with laser for lattice degeneration as below - pt unable to stay for laser today -- will reschedule ASAP for laser retinopexy OU, OD first   3. Lattice degeneration w/ atrophic holes, both eyes - scattered patches of pigmented lattice almost 360 OU - discussed findings, prognosis, and treatment options including observation - recommend laser retinopexy - pt wishes to proceed with laser, but unable to proceed today - will reschedule ASAP for laser retinopexy OU, OD first  4. No retinal edema on exam or OCT  5. Combined form cataract OU - The symptoms of cataract, surgical options, and treatments and risks were discussed with patient. - discussed diagnosis and progression - not yet visually significant - monitor for now  6,7. History of high myopia w/ astigmatism s/p ALK OU - done by Gershon Crane in the 90s - corneas clear and stable\ - BCVA 20/20 OU  8. ABMD OD - mild map/dot/fingerprint OD - monitor   Ophthalmic Meds Ordered this visit:  No orders of the defined types were placed in this encounter.      Return in about 1 week (around 02/07/2018) for Laser.  There are no Patient Instructions on file for this visit.   Explained the diagnoses, plan, and follow up with the patient and they expressed understanding.  Patient expressed understanding of the importance of proper follow up care.   This document serves as a record of services personally performed by Gardiner Sleeper, MD, PhD. It was created on their behalf by Ernest Mallick, OA, an ophthalmic assistant. The creation of this record is the  provider's dictation and/or activities during the visit.    Electronically signed by: Ernest Mallick, OA  01/31/2018 5:10 PM    Gardiner Sleeper, M.D., Ph.D. Diseases & Surgery of the Retina and Vitreous [facility]  I have reviewed the above  documentation for accuracy and completeness, and I agree with the above. Gardiner Sleeper, M.D., Ph.D. 02/04/18 5:10 PM      Abbreviations: M myopia (nearsighted); A astigmatism; H hyperopia (farsighted); P presbyopia; Mrx spectacle prescription;  CTL contact lenses; OD right eye; OS left eye; OU both eyes  XT exotropia; ET esotropia; PEK punctate epithelial keratitis; PEE punctate epithelial erosions; DES dry eye syndrome; MGD meibomian gland dysfunction; ATs artificial tears; PFAT's preservative free artificial tears; [place] nuclear sclerotic cataract; PSC posterior subcapsular cataract; ERM epi-retinal membrane; PVD posterior vitreous detachment; RD retinal detachment; DM diabetes mellitus; DR diabetic retinopathy; NPDR non-proliferative diabetic retinopathy; PDR proliferative diabetic retinopathy; CSME clinically significant macular edema; DME diabetic macular edema; dbh dot blot hemorrhages; CWS cotton wool spot; POAG primary open angle glaucoma; C/D cup-to-disc ratio; HVF humphrey visual field; GVF goldmann visual field; OCT optical coherence tomography; IOP intraocular pressure; BRVO Branch retinal vein occlusion; CRVO central retinal vein occlusion; CRAO central retinal artery occlusion; BRAO branch retinal artery occlusion; RT retinal tear; SB scleral buckle; PPV pars plana vitrectomy; VH Vitreous hemorrhage; PRP panretinal laser photocoagulation; IVK intravitreal kenalog; VMT vitreomacular traction; MH Macular hole;  NVD neovascularization of the disc; NVE neovascularization elsewhere; AREDS age related eye disease study; ARMD age related macular degeneration; POAG primary open angle glaucoma; EBMD epithelial/anterior basement membrane dystrophy; ACIOL anterior chamber intraocular lens; IOL intraocular lens; PCIOL posterior chamber intraocular lens; Phaco/IOL phacoemulsification with intraocular lens placement; [place] photorefractive keratectomy; LASIK laser assisted in situ keratomileusis; HTN  hypertension; DM diabetes mellitus; COPD chronic obstructive pulmonary disease

## 2018-01-31 ENCOUNTER — Encounter (INDEPENDENT_AMBULATORY_CARE_PROVIDER_SITE_OTHER): Payer: Self-pay | Admitting: Ophthalmology

## 2018-01-31 ENCOUNTER — Ambulatory Visit (INDEPENDENT_AMBULATORY_CARE_PROVIDER_SITE_OTHER): Payer: 59 | Admitting: Ophthalmology

## 2018-01-31 DIAGNOSIS — H18529 Epithelial (juvenile) corneal dystrophy, unspecified eye: Secondary | ICD-10-CM

## 2018-01-31 DIAGNOSIS — D3131 Benign neoplasm of right choroid: Secondary | ICD-10-CM | POA: Diagnosis not present

## 2018-01-31 DIAGNOSIS — H3581 Retinal edema: Secondary | ICD-10-CM | POA: Diagnosis not present

## 2018-01-31 DIAGNOSIS — H35413 Lattice degeneration of retina, bilateral: Secondary | ICD-10-CM | POA: Diagnosis not present

## 2018-01-31 DIAGNOSIS — H1852 Epithelial (juvenile) corneal dystrophy: Secondary | ICD-10-CM | POA: Diagnosis not present

## 2018-01-31 DIAGNOSIS — H25813 Combined forms of age-related cataract, bilateral: Secondary | ICD-10-CM

## 2018-01-31 DIAGNOSIS — Z947 Corneal transplant status: Secondary | ICD-10-CM

## 2018-01-31 DIAGNOSIS — H33321 Round hole, right eye: Secondary | ICD-10-CM | POA: Diagnosis not present

## 2018-01-31 DIAGNOSIS — H52203 Unspecified astigmatism, bilateral: Secondary | ICD-10-CM | POA: Diagnosis not present

## 2018-01-31 DIAGNOSIS — H5213 Myopia, bilateral: Secondary | ICD-10-CM

## 2018-02-04 ENCOUNTER — Encounter (INDEPENDENT_AMBULATORY_CARE_PROVIDER_SITE_OTHER): Payer: Self-pay | Admitting: Ophthalmology

## 2018-04-03 DIAGNOSIS — H35372 Puckering of macula, left eye: Secondary | ICD-10-CM | POA: Diagnosis not present

## 2018-04-03 DIAGNOSIS — H3581 Retinal edema: Secondary | ICD-10-CM | POA: Diagnosis not present

## 2018-04-03 DIAGNOSIS — H43393 Other vitreous opacities, bilateral: Secondary | ICD-10-CM | POA: Diagnosis not present

## 2018-04-20 ENCOUNTER — Telehealth: Payer: Self-pay | Admitting: Family Medicine

## 2018-04-20 DIAGNOSIS — R7989 Other specified abnormal findings of blood chemistry: Secondary | ICD-10-CM

## 2018-04-20 DIAGNOSIS — Z125 Encounter for screening for malignant neoplasm of prostate: Secondary | ICD-10-CM

## 2018-04-20 DIAGNOSIS — E538 Deficiency of other specified B group vitamins: Secondary | ICD-10-CM

## 2018-04-20 DIAGNOSIS — Z0000 Encounter for general adult medical examination without abnormal findings: Secondary | ICD-10-CM

## 2018-04-20 NOTE — Telephone Encounter (Signed)
-----   Message from Ellamae Sia sent at 04/15/2018  9:48 AM EDT ----- Regarding: Lab orders for Thursday, 8.15.19 Patient is scheduled for CPX labs, please order future labs, Thanks , Karna Christmas

## 2018-04-24 ENCOUNTER — Other Ambulatory Visit (INDEPENDENT_AMBULATORY_CARE_PROVIDER_SITE_OTHER): Payer: 59

## 2018-04-24 DIAGNOSIS — E538 Deficiency of other specified B group vitamins: Secondary | ICD-10-CM | POA: Diagnosis not present

## 2018-04-24 DIAGNOSIS — Z125 Encounter for screening for malignant neoplasm of prostate: Secondary | ICD-10-CM | POA: Diagnosis not present

## 2018-04-24 DIAGNOSIS — Z0000 Encounter for general adult medical examination without abnormal findings: Secondary | ICD-10-CM | POA: Diagnosis not present

## 2018-04-24 DIAGNOSIS — R7989 Other specified abnormal findings of blood chemistry: Secondary | ICD-10-CM

## 2018-04-24 LAB — LIPID PANEL
CHOLESTEROL: 178 mg/dL (ref 0–200)
HDL: 49.3 mg/dL (ref >39.00)
LDL Cholesterol: 119 mg/dL — ABNORMAL HIGH (ref 0–99)
NonHDL: 129.02
TRIGLYCERIDES: 50 mg/dL (ref 0.0–149.0)
Total CHOL/HDL Ratio: 4
VLDL: 10 mg/dL (ref 0.0–40.0)

## 2018-04-24 LAB — CBC WITH DIFFERENTIAL/PLATELET
BASOS ABS: 0 10*3/uL (ref 0.0–0.1)
Basophils Relative: 0.6 % (ref 0.0–3.0)
EOS PCT: 3.2 % (ref 0.0–5.0)
Eosinophils Absolute: 0.2 10*3/uL (ref 0.0–0.7)
HCT: 43.3 % (ref 39.0–52.0)
HEMOGLOBIN: 14.6 g/dL (ref 13.0–17.0)
Lymphocytes Relative: 26.7 % (ref 12.0–46.0)
Lymphs Abs: 1.7 10*3/uL (ref 0.7–4.0)
MCHC: 33.7 g/dL (ref 30.0–36.0)
MCV: 90 fl (ref 78.0–100.0)
MONOS PCT: 8.6 % (ref 3.0–12.0)
Monocytes Absolute: 0.5 10*3/uL (ref 0.1–1.0)
Neutro Abs: 3.8 10*3/uL (ref 1.4–7.7)
Neutrophils Relative %: 60.9 % (ref 43.0–77.0)
Platelets: 251 10*3/uL (ref 150.0–400.0)
RBC: 4.81 Mil/uL (ref 4.22–5.81)
RDW: 13 % (ref 11.5–15.5)
WBC: 6.3 10*3/uL (ref 4.0–10.5)

## 2018-04-24 LAB — COMPREHENSIVE METABOLIC PANEL
ALBUMIN: 4.1 g/dL (ref 3.5–5.2)
ALK PHOS: 65 U/L (ref 39–117)
ALT: 32 U/L (ref 0–53)
AST: 26 U/L (ref 0–37)
BILIRUBIN TOTAL: 0.6 mg/dL (ref 0.2–1.2)
BUN: 14 mg/dL (ref 6–23)
CO2: 29 mEq/L (ref 19–32)
Calcium: 9.6 mg/dL (ref 8.4–10.5)
Chloride: 105 mEq/L (ref 96–112)
Creatinine, Ser: 1.17 mg/dL (ref 0.40–1.50)
GFR: 70.03 mL/min (ref >60.00)
Glucose, Bld: 99 mg/dL (ref 70–99)
POTASSIUM: 4.3 meq/L (ref 3.5–5.1)
Sodium: 141 mEq/L (ref 135–145)
TOTAL PROTEIN: 6.5 g/dL (ref 6.0–8.3)

## 2018-04-24 LAB — TSH: TSH: 7.22 u[IU]/mL — ABNORMAL HIGH (ref 0.35–4.50)

## 2018-04-24 LAB — T4, FREE: FREE T4: 0.77 ng/dL (ref 0.60–1.60)

## 2018-04-24 LAB — VITAMIN B12: Vitamin B-12: 384 pg/mL (ref 211–911)

## 2018-04-24 LAB — PSA: PSA: 0.46 ng/mL (ref 0.10–4.00)

## 2018-04-30 ENCOUNTER — Encounter: Payer: Self-pay | Admitting: Family Medicine

## 2018-04-30 ENCOUNTER — Ambulatory Visit (INDEPENDENT_AMBULATORY_CARE_PROVIDER_SITE_OTHER): Payer: 59 | Admitting: Family Medicine

## 2018-04-30 VITALS — BP 136/78 | HR 85 | Temp 98.2°F | Ht 67.0 in | Wt 176.0 lb

## 2018-04-30 DIAGNOSIS — Z1211 Encounter for screening for malignant neoplasm of colon: Secondary | ICD-10-CM | POA: Insufficient documentation

## 2018-04-30 DIAGNOSIS — Z0000 Encounter for general adult medical examination without abnormal findings: Secondary | ICD-10-CM | POA: Diagnosis not present

## 2018-04-30 DIAGNOSIS — E039 Hypothyroidism, unspecified: Secondary | ICD-10-CM | POA: Diagnosis not present

## 2018-04-30 DIAGNOSIS — Z125 Encounter for screening for malignant neoplasm of prostate: Secondary | ICD-10-CM

## 2018-04-30 DIAGNOSIS — N529 Male erectile dysfunction, unspecified: Secondary | ICD-10-CM

## 2018-04-30 DIAGNOSIS — E038 Other specified hypothyroidism: Secondary | ICD-10-CM

## 2018-04-30 DIAGNOSIS — E538 Deficiency of other specified B group vitamins: Secondary | ICD-10-CM

## 2018-04-30 NOTE — Assessment & Plan Note (Signed)
Intolerant to viagra- gave him a moderate to severe headache I am hesitant to start a med in the similar class due to this reaction  Offered ref to urology in the future if he wants to discuss other options

## 2018-04-30 NOTE — Assessment & Plan Note (Signed)
Lab Results  Component Value Date   VITAMINB12 384 04/24/2018   Doing well with oral supplementation

## 2018-04-30 NOTE — Patient Instructions (Addendum)
If you are interested in the new shingles vaccine (Shingrix) - call your local pharmacy to check on coverage and availability  If affordable - get on a wait list   Get a flu shot in the fall   Avoid red meat/ fried foods/ egg yolks/ fatty breakfast meats/ butter, cheese and high fat dairy/ and shellfish    Thyroid is slowing down- so let us know if you develop new/worse fatigue or hair or skin changes

## 2018-04-30 NOTE — Assessment & Plan Note (Signed)
Ref for first screening colonoscopy  

## 2018-04-30 NOTE — Assessment & Plan Note (Signed)
Reviewed health habits including diet and exercise and skin cancer prevention Reviewed appropriate screening tests for age  Also reviewed health mt list, fam hx and immunization status , as well as social and family history   Labs rev-disc better diet for cholesterol  Also monitoring thyroid  Ref for first screening colonoscopy  Disc shingrix vaccine - interested in this  Also enc flu shot in the fall

## 2018-04-30 NOTE — Assessment & Plan Note (Signed)
Lab Results  Component Value Date   TSH 7.22 (H) 04/24/2018   this is up from the 4 range FT4 however is still in the normal range Suspect he is subclinical (no symptoms)  Will watch this closely  Hold off on medication unless further change or new symptoms

## 2018-04-30 NOTE — Progress Notes (Signed)
Subjective:    Patient ID: Warren Hatfield, male    DOB: [birth year], 52 y.o.   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)  Plans on getting laser procedure later   Wt Readings from Last 3 Encounters:  04/30/18 176 lb (79.8 kg)  03/25/17 176 lb 8 oz (80.1 kg)  04/26/16 169 lb 8 oz (76.9 kg)  not a lot of time for self care - not exercising enough  Work is very busy  Also small child  27.57 kg/m   Colon cancer screening No family hx of colon cancer  Is interested in colonoscopy   Flu shot -gets at work in the fall   Tetanus vaccine 7/18   Zoster status -interested in shingrix   Prostate screen Lab Results  Component Value Date   PSA 0.46 04/24/2018   PSA 0.47 03/19/2017   no prostate cancer in the family  No voiding problems  Nocturia 0-1 times     H/o B12 def Lab Results  Component Value Date   VITAMINB12 384 04/24/2018   Stable from a year ago with current supplementation  Blood pressure BP Readings from Last 3 Encounters:  04/30/18 136/78  11/27/17 113/66  03/25/17 126/68   Pulse Readings from Last 3 Encounters:  04/30/18 85  11/27/17 63  03/25/17 71   Cholesterol Lab Results  Component Value Date   CHOL 178 04/24/2018   CHOL 168 03/19/2017   CHOL 160 11/10/2015   Lab Results  Component Value Date   HDL 49.30 04/24/2018   HDL 48.50 03/19/2017   HDL 46.00 11/10/2015   Lab Results  Component Value Date   LDLCALC 119 (H) 04/24/2018   LDLCALC 109 (H) 03/19/2017   LDLCALC 106 (H) 11/10/2015   Lab Results  Component Value Date   TRIG 50.0 04/24/2018   TRIG 50.0 03/19/2017   TRIG 38.0 11/10/2015   Lab Results  Component Value Date   CHOLHDL 4 04/24/2018   CHOLHDL 3 03/19/2017   CHOLHDL 3 11/10/2015   No results found  for: LDLDIRECT  LDL is up to 119  occ fried foods /no fast food  Does eat beef   Thyroid  Lab Results  Component Value Date   TSH 7.22 (H) 04/24/2018   Free T4 is no at 0.77 Family hx of hypothyroidism  No more fatigue than normal with schedule  Has not noticed a big change No skin or hair changes    TSH is up from 4.5  occ smokes a cigar   Takes viagra for ED Took it 3 times and gave him a bad head ache   Lab Results  Component Value Date   WBC 6.3 04/24/2018   HGB 14.6 04/24/2018   HCT 43.3 04/24/2018   MCV 90.0 04/24/2018   PLT 251.0 04/24/2018   Lab Results  Component Value Date   CREATININE 1.17 04/24/2018   BUN 14 04/24/2018   NA 141 04/24/2018   K 4.3 04/24/2018   CL 105 04/24/2018   CO2 29 04/24/2018   Lab Results  Component Value Date   ALT 32 04/24/2018   AST 26 04/24/2018   ALKPHOS 65 04/24/2018   BILITOT 0.6 04/24/2018    Patient Active Problem List   Diagnosis  Date Noted  . Colon cancer screening 04/30/2018  . Erectile dysfunction 03/25/2017  . Subclinical hypothyroidism 03/25/2017  . Prostate cancer screening 03/17/2017  . Eczema 11/18/2015  . Screening for lipoid disorders 05/10/2015  . Routine general medical examination at a health care facility 12/22/2010  . B12 deficiency 11/16/2009   Past Medical History:  Diagnosis Date  . Elevated TSH   . Hip pain   . Stress reaction    Past Surgical History:  Procedure Laterality Date  . HAND SURGERY  1998   plate nd screws in 3rd finger of right hand following fx  . LASIK  1993   Both eyes  . TOTAL HIP ARTHROPLASTY  02/11  . VASECTOMY  02/08   Social History   Tobacco Use  . Smoking status: Light Tobacco Smoker    Types: Cigars  . Smokeless tobacco: Never Used  Substance Use Topics  . Alcohol use: Yes    Alcohol/week: 0.0 standard drinks    Comment: 1-2 drinks per month maximum  . Drug use: No   Family History  Problem Relation Age of Onset  . Heart disease Father        MI   . Hypertension Father   . Hypothyroidism Mother   . Diabetes Maternal Grandfather   . Diabetes Paternal Grandmother    Allergies  Allergen Reactions  . Oxycodone-Acetaminophen Nausea Only  . Sildenafil     Pt stated, "It gave me a headache"   No current outpatient medications on file prior to visit.   No current facility-administered medications on file prior to visit.      Review of Systems  Constitutional: Negative for activity change, appetite change, fatigue, fever and unexpected weight change.  HENT: Negative for congestion, rhinorrhea, sore throat and trouble swallowing.   Eyes: Negative for pain, redness, itching and visual disturbance.  Respiratory: Negative for cough, chest tightness, shortness of breath and wheezing.   Cardiovascular: Negative for chest pain and palpitations.  Gastrointestinal: Negative for abdominal pain, blood in stool, constipation, diarrhea and nausea.  Endocrine: Negative for cold intolerance, heat intolerance, polydipsia and polyuria.  Genitourinary: Negative for difficulty urinating, dysuria, frequency and urgency.       ED mild   Musculoskeletal: Negative for arthralgias, joint swelling and myalgias.  Skin: Negative for pallor and rash.       No change in dry skin   Neurological: Negative for dizziness, tremors, weakness, numbness and headaches.  Hematological: Negative for adenopathy. Does not bruise/bleed easily.  Psychiatric/Behavioral: Negative for decreased concentration and dysphoric mood. The patient is not nervous/anxious.        Objective:   Physical Exam  Constitutional: He appears well-developed and well-nourished. No distress.  Well appearing   HENT:  Head: Normocephalic and atraumatic.  Right Ear: External ear normal.  Left Ear: External ear normal.  Nose: Nose normal.  Mouth/Throat: Oropharynx is clear and moist.  Mild clear pnd   Eyes: Pupils are equal, round, and reactive to light. Conjunctivae and EOM are normal.  Right eye exhibits no discharge. Left eye exhibits no discharge. No scleral icterus.  Neck: Normal range of motion. Neck supple. No JVD present. Carotid bruit is not present. No thyromegaly present.  Cardiovascular: Normal rate, regular rhythm, normal heart sounds and intact distal pulses. Exam reveals no gallop.  Pulmonary/Chest: Effort normal and breath sounds normal. No respiratory distress. He has no wheezes. He exhibits no tenderness.  Abdominal: Soft. Bowel sounds are normal. He exhibits no distension, no abdominal bruit  and no mass. There is no tenderness.  Musculoskeletal: He exhibits no edema, tenderness or deformity.  Lymphadenopathy:    He has no cervical adenopathy.  Neurological: He is alert. He has normal reflexes. He displays normal reflexes. No cranial nerve deficit. He exhibits normal muscle tone. Coordination normal.  Skin: Skin is warm and dry. No rash noted. No erythema. No pallor.  Some dry skin on hands Wart on R thumb (tx by dermatology)  Psychiatric: He has a normal mood and affect. He expresses inappropriate judgment.  Pleasant           Assessment & Plan:   Problem List Items Addressed This Visit      Endocrine   Subclinical hypothyroidism    Lab Results  Component Value Date   TSH 7.22 (H) 04/24/2018   this is up from the 4 range FT4 however is still in the normal range Suspect he is subclinical (no symptoms)  Will watch this closely  Hold off on medication unless further change or new symptoms         Genitourinary   Erectile dysfunction    Intolerant to viagra- gave him a moderate to severe headache I am hesitant to start a med in the similar class due to this reaction  Offered ref to urology in the future if he wants to discuss other options         Other   B12 deficiency    Lab Results  Component Value Date   VITAMINB12 384 04/24/2018   Doing well with oral supplementation       Colon cancer screening    Ref for first screening  colonoscopy       Relevant Orders   Ambulatory referral to Gastroenterology   Prostate cancer screening    Lab Results  Component Value Date   PSA 0.46 04/24/2018   PSA 0.47 03/19/2017   No family hx of prostate cancer  No urinary symptoms  Nocturia 0-1 times  Continue to follow       Routine general medical examination at a health care facility - Primary    Reviewed health habits including diet and exercise and skin cancer prevention Reviewed appropriate screening tests for age  Also reviewed health mt list, fam hx and immunization status , as well as social and family history   Labs rev-disc better diet for cholesterol  Also monitoring thyroid  Ref for first screening colonoscopy  Disc shingrix vaccine - interested in this  Also enc flu shot in the fall

## 2018-04-30 NOTE — Assessment & Plan Note (Signed)
Lab Results  Component Value Date   PSA 0.46 04/24/2018   PSA 0.47 03/19/2017   No family hx of prostate cancer  No urinary symptoms  Nocturia 0-1 times  Continue to follow

## 2018-06-06 ENCOUNTER — Encounter: Payer: Self-pay | Admitting: Family Medicine

## 2018-08-31 DIAGNOSIS — Z23 Encounter for immunization: Secondary | ICD-10-CM | POA: Diagnosis not present

## 2019-02-26 ENCOUNTER — Telehealth: Payer: Self-pay | Admitting: Family Medicine

## 2019-02-26 NOTE — Telephone Encounter (Signed)
Best number [phone] Pt called stated daughter boyfriend test positive for covid he got results today.  Boyfriend was at his house last night for supper.  Pt wanted to know what he needed to do  Pt has no symptoms

## 2019-02-27 NOTE — Telephone Encounter (Signed)
plz add to [place] f/u list to check on him on Mon or Tues. Thanks.

## 2019-02-27 NOTE — Telephone Encounter (Signed)
Pt advised of Dr. 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

## 2019-02-27 NOTE — Telephone Encounter (Signed)
Will route to another provider since PCP is out of the office

## 2019-02-27 NOTE — Telephone Encounter (Signed)
If he was in close contact with daughter's BF (closer than 6 feet >20 min without mask), recommend self quarantine at home for himself and anyone also exposed for 14 days starting from date of dinner (last night).  It's probably too early to test him for Covid19 given such a recent exposure. Would just have him monitor for new symptoms (fever, chill, cough, dyspnea, ST, HA, body aches, diarrhea) and let us know if symptoms develop.  Let us know if he needs note for work.

## 2019-02-27 NOTE — Telephone Encounter (Signed)
Noted  

## 2019-03-04 ENCOUNTER — Telehealth: Payer: Self-pay

## 2019-03-04 NOTE — Telephone Encounter (Signed)
See TE, 03/04/19.

## 2019-03-04 NOTE — Telephone Encounter (Signed)
Noted! Thank you

## 2019-03-04 NOTE — Telephone Encounter (Signed)
Spoke with pt asking how he is feeling.  Says he is feeling fine and denies any sxs.

## 2019-06-28 ENCOUNTER — Telehealth: Payer: Self-pay | Admitting: Family Medicine

## 2019-06-28 DIAGNOSIS — E538 Deficiency of other specified B group vitamins: Secondary | ICD-10-CM

## 2019-06-28 DIAGNOSIS — Z0000 Encounter for general adult medical examination without abnormal findings: Secondary | ICD-10-CM

## 2019-06-28 DIAGNOSIS — E038 Other specified hypothyroidism: Secondary | ICD-10-CM

## 2019-06-28 DIAGNOSIS — E039 Hypothyroidism, unspecified: Secondary | ICD-10-CM

## 2019-06-28 DIAGNOSIS — Z125 Encounter for screening for malignant neoplasm of prostate: Secondary | ICD-10-CM

## 2019-06-28 NOTE — Telephone Encounter (Signed)
-----   Message from Ellamae Sia sent at 06/22/2019  3:42 PM EDT ----- Regarding: Lab orders for Monday, 10.19.20 Patient is scheduled for CPX labs, please order future labs, Thanks , Karna Christmas

## 2019-06-29 ENCOUNTER — Other Ambulatory Visit (INDEPENDENT_AMBULATORY_CARE_PROVIDER_SITE_OTHER): Payer: 59

## 2019-06-29 ENCOUNTER — Other Ambulatory Visit: Payer: Self-pay

## 2019-06-29 DIAGNOSIS — E039 Hypothyroidism, unspecified: Secondary | ICD-10-CM

## 2019-06-29 DIAGNOSIS — Z125 Encounter for screening for malignant neoplasm of prostate: Secondary | ICD-10-CM | POA: Diagnosis not present

## 2019-06-29 DIAGNOSIS — E538 Deficiency of other specified B group vitamins: Secondary | ICD-10-CM | POA: Diagnosis not present

## 2019-06-29 DIAGNOSIS — Z0000 Encounter for general adult medical examination without abnormal findings: Secondary | ICD-10-CM

## 2019-06-29 DIAGNOSIS — E038 Other specified hypothyroidism: Secondary | ICD-10-CM

## 2019-06-29 LAB — CBC WITH DIFFERENTIAL/PLATELET
Basophils Absolute: 0 10*3/uL (ref 0.0–0.1)
Basophils Relative: 0.8 % (ref 0.0–3.0)
Eosinophils Absolute: 0.2 10*3/uL (ref 0.0–0.7)
Eosinophils Relative: 5 % (ref 0.0–5.0)
HCT: 40.7 % (ref 39.0–52.0)
Hemoglobin: 13.9 g/dL (ref 13.0–17.0)
Lymphocytes Relative: 39.4 % (ref 12.0–46.0)
Lymphs Abs: 2 10*3/uL (ref 0.7–4.0)
MCHC: 34 g/dL (ref 30.0–36.0)
MCV: 91.2 fl (ref 78.0–100.0)
Monocytes Absolute: 0.4 10*3/uL (ref 0.1–1.0)
Monocytes Relative: 8.6 % (ref 3.0–12.0)
Neutro Abs: 2.3 10*3/uL (ref 1.4–7.7)
Neutrophils Relative %: 46.2 % (ref 43.0–77.0)
Platelets: 186 10*3/uL (ref 150.0–400.0)
RBC: 4.46 Mil/uL (ref 4.22–5.81)
RDW: 13.1 % (ref 11.5–15.5)
WBC: 5 10*3/uL (ref 4.0–10.5)

## 2019-06-29 LAB — LIPID PANEL
Cholesterol: 171 mg/dL (ref 0–200)
HDL: 43.1 mg/dL (ref >39.00)
LDL Cholesterol: 118 mg/dL — ABNORMAL HIGH (ref 0–99)
NonHDL: 128.24
Total CHOL/HDL Ratio: 4
Triglycerides: 49 mg/dL (ref 0.0–149.0)
VLDL: 9.8 mg/dL (ref 0.0–40.0)

## 2019-06-29 LAB — COMPREHENSIVE METABOLIC PANEL
ALT: 31 U/L (ref 0–53)
AST: 28 U/L (ref 0–37)
Albumin: 4.1 g/dL (ref 3.5–5.2)
Alkaline Phosphatase: 55 U/L (ref 39–117)
BUN: 16 mg/dL (ref 6–23)
CO2: 30 mEq/L (ref 19–32)
Calcium: 9.1 mg/dL (ref 8.4–10.5)
Chloride: 103 mEq/L (ref 96–112)
Creatinine, Ser: 1.02 mg/dL (ref 0.40–1.50)
GFR: 76.83 mL/min (ref >60.00)
Glucose, Bld: 93 mg/dL (ref 70–99)
Potassium: 4 mEq/L (ref 3.5–5.1)
Sodium: 139 mEq/L (ref 135–145)
Total Bilirubin: 0.7 mg/dL (ref 0.2–1.2)
Total Protein: 6 g/dL (ref 6.0–8.3)

## 2019-06-29 LAB — T4, FREE: Free T4: 0.79 ng/dL (ref 0.60–1.60)

## 2019-06-29 LAB — VITAMIN B12: Vitamin B-12: 236 pg/mL (ref 211–911)

## 2019-06-29 LAB — PSA: PSA: 0.38 ng/mL (ref 0.10–4.00)

## 2019-06-29 LAB — TSH: TSH: 5.23 u[IU]/mL — ABNORMAL HIGH (ref 0.35–4.50)

## 2019-06-30 ENCOUNTER — Encounter: Payer: Self-pay | Admitting: *Deleted

## 2019-07-06 ENCOUNTER — Encounter: Payer: 59 | Admitting: Family Medicine

## 2019-08-04 ENCOUNTER — Encounter: Payer: Self-pay | Admitting: Family Medicine

## 2019-08-04 ENCOUNTER — Ambulatory Visit (INDEPENDENT_AMBULATORY_CARE_PROVIDER_SITE_OTHER): Payer: 59 | Admitting: Family Medicine

## 2019-08-04 ENCOUNTER — Other Ambulatory Visit: Payer: Self-pay

## 2019-08-04 VITALS — BP 124/62 | HR 80 | Temp 98.0°F | Ht 67.25 in | Wt 170.4 lb

## 2019-08-04 DIAGNOSIS — E038 Other specified hypothyroidism: Secondary | ICD-10-CM

## 2019-08-04 DIAGNOSIS — Z1211 Encounter for screening for malignant neoplasm of colon: Secondary | ICD-10-CM

## 2019-08-04 DIAGNOSIS — E039 Hypothyroidism, unspecified: Secondary | ICD-10-CM

## 2019-08-04 DIAGNOSIS — Z125 Encounter for screening for malignant neoplasm of prostate: Secondary | ICD-10-CM

## 2019-08-04 DIAGNOSIS — Z0000 Encounter for general adult medical examination without abnormal findings: Secondary | ICD-10-CM

## 2019-08-04 DIAGNOSIS — E538 Deficiency of other specified B group vitamins: Secondary | ICD-10-CM

## 2019-08-04 DIAGNOSIS — N529 Male erectile dysfunction, unspecified: Secondary | ICD-10-CM

## 2019-08-04 MED ORDER — TADALAFIL 20 MG PO TABS: 10.0000 mg | ORAL_TABLET | Freq: Every day | ORAL | 11 refills | Status: DC | PRN

## 2019-08-04 NOTE — Assessment & Plan Note (Signed)
Lab Results  Component Value Date   TSH 5.23 (H) 06/29/2019    FT4 still in the normal range  No symptoms Will continue to monitor

## 2019-08-04 NOTE — Assessment & Plan Note (Signed)
B12 level is down  Lab Results  Component Value Date   VITAMINB12 236 06/29/2019   Recommend otc B12 [phone] mcg daily  No symptoms currently

## 2019-08-04 NOTE — Assessment & Plan Note (Signed)
Referral done for first screening colonoscopy 

## 2019-08-04 NOTE — Assessment & Plan Note (Signed)
Trial of generic cialis  viagra gave him a headache  If side effect -will stop it

## 2019-08-04 NOTE — Assessment & Plan Note (Signed)
Reviewed health habits including diet and exercise and skin cancer prevention Reviewed appropriate screening tests for age  Also reviewed health mt list, fam hx and immunization status , as well as social and family history   See HPI Labs reviewed  Disc strategy for low cholesterol diet and handout given  Ref done for screening colonoscopy  Disc shingrix vaccine and he will check on coverage

## 2019-08-04 NOTE — Progress Notes (Signed)
Subjective:    Patient ID: Warren Hatfield, male    DOB: 1966-04-17, 53 y.o.   MRN: [id number]  This visit occurred during the SARS-CoV-2 public health emergency.  Safety protocols were in place, including screening questions prior to the visit, additional usage of staff PPE, and extensive cleaning of exam room while observing appropriate contact time as indicated for disinfecting solutions.    HPI Here for health maintenance exam and to review chronic medical problems    Doing well  Work is not affected by the pandemic   Wt Readings from Last 3 Encounters:  08/04/19 170 lb 7 oz (77.3 kg)  04/30/18 176 lb (79.8 kg)  03/25/17 176 lb 8 oz (80.1 kg)  lost some weight  Lots of yard work  26.50 kg/m   Eating is pretty healthy   Colon cancer screening  He is ready to schedule this   Tdap 7/18  Zoster status -interested in shingrix   Flu vaccine 10/5  Prostate health  No family h/o prostate cancer  Nocturia times one  Lab Results  Component Value Date   PSA 0.38 06/29/2019   PSA 0.46 04/24/2018   PSA 0.47 03/19/2017    BP Readings from Last 3 Encounters:  08/04/19 124/62  04/30/18 136/78  11/27/17 113/66   Pulse Readings from Last 3 Encounters:  08/04/19 80  04/30/18 85  11/27/17 63    Subclinical hypothyroid Lab Results  Component Value Date   TSH 5.23 (H) 06/29/2019   down from 7.2  Free T4 in the nl range at 0.79 No symptoms at all    B12 def Lab Results  Component Value Date   VITAMINB12 236 06/29/2019  oral supplementation   Cholesterol  Lab Results  Component Value Date   CHOL 171 06/29/2019   CHOL 178 04/24/2018   CHOL 168 03/19/2017   Lab Results  Component Value Date   HDL 43.10 06/29/2019   HDL 49.30 04/24/2018   HDL 48.50 03/19/2017   Lab Results  Component Value Date   LDLCALC 118 (H) 06/29/2019   LDLCALC 119 (H) 04/24/2018   LDLCALC 109 (H) 03/19/2017   Lab Results  Component Value Date   TRIG 49.0 06/29/2019   TRIG  50.0 04/24/2018   TRIG 50.0 03/19/2017   Lab Results  Component Value Date   CHOLHDL 4 06/29/2019   CHOLHDL 4 04/24/2018   CHOLHDL 3 03/19/2017   No results found for: LDLDIRECT Not bad but could improve    Would like to try another med for ED Sildenafil gave him a headache   Patient Active Problem List   Diagnosis Date Noted  . Colon cancer screening 04/30/2018  . Erectile dysfunction 03/25/2017  . Subclinical hypothyroidism 03/25/2017  . Prostate cancer screening 03/17/2017  . Eczema 11/18/2015  . Screening for lipoid disorders 05/10/2015  . Routine general medical examination at a health care facility 12/22/2010  . B12 deficiency 11/16/2009   Past Medical History:  Diagnosis Date  . Elevated TSH   . Hip pain   . Stress reaction    Past Surgical History:  Procedure Laterality Date  . HAND SURGERY  1998   plate nd screws in 3rd finger of right hand following fx  . LASIK  1993   Both eyes  . TOTAL HIP ARTHROPLASTY  02/11  . VASECTOMY  02/08   Social History   Tobacco Use  . Smoking status: Light Tobacco Smoker    Types: Cigars  . Smokeless tobacco: Never  Used  Substance Use Topics  . Alcohol use: Yes    Alcohol/week: 0.0 standard drinks    Comment: 1-2 drinks per month maximum  . Drug use: No   Family History  Problem Relation Age of Onset  . Heart disease Father        MI  . Hypertension Father   . Hypothyroidism Mother   . Diabetes Maternal Grandfather   . Diabetes Paternal Grandmother    Allergies  Allergen Reactions  . Oxycodone-Acetaminophen Nausea Only  . Sildenafil     Pt stated, "It gave me a headache"   No current outpatient medications on file prior to visit.   No current facility-administered medications on file prior to visit.     Review of Systems  Constitutional: Negative for activity change, appetite change, fatigue, fever and unexpected weight change.  HENT: Negative for congestion, rhinorrhea, sore throat and trouble  swallowing.   Eyes: Negative for pain, redness, itching and visual disturbance.  Respiratory: Negative for cough, chest tightness, shortness of breath and wheezing.   Cardiovascular: Negative for chest pain and palpitations.  Gastrointestinal: Negative for abdominal pain, blood in stool, constipation, diarrhea and nausea.  Endocrine: Negative for cold intolerance, heat intolerance, polydipsia and polyuria.  Genitourinary: Negative for difficulty urinating, dysuria, frequency and urgency.       ED  Musculoskeletal: Negative for arthralgias, joint swelling and myalgias.  Skin: Negative for pallor and rash.  Neurological: Negative for dizziness, tremors, weakness, numbness and headaches.  Hematological: Negative for adenopathy. Does not bruise/bleed easily.  Psychiatric/Behavioral: Negative for decreased concentration and dysphoric mood. The patient is not nervous/anxious.        Objective:   Physical Exam Constitutional:      General: He is not in acute distress.    Appearance: Normal appearance. He is well-developed and normal weight. He is not ill-appearing or diaphoretic.  HENT:     Head: Normocephalic and atraumatic.     Right Ear: Tympanic membrane, ear canal and external ear normal.     Left Ear: Tympanic membrane, ear canal and external ear normal.     Nose: Nose normal. No congestion.     Mouth/Throat:     Mouth: Mucous membranes are moist.     Pharynx: Oropharynx is clear. No posterior oropharyngeal erythema.  Eyes:     General: No scleral icterus.       Right eye: No discharge.        Left eye: No discharge.     Conjunctiva/sclera: Conjunctivae normal.     Pupils: Pupils are equal, round, and reactive to light.  Neck:     Musculoskeletal: Normal range of motion and neck supple. No neck rigidity or muscular tenderness.     Thyroid: No thyromegaly.     Vascular: No carotid bruit or JVD.  Cardiovascular:     Rate and Rhythm: Normal rate and regular rhythm.     Pulses:  Normal pulses.     Heart sounds: Normal heart sounds. No gallop.   Pulmonary:     Effort: Pulmonary effort is normal. No respiratory distress.     Breath sounds: Normal breath sounds. No wheezing or rales.     Comments: Good air exch Chest:     Chest wall: No tenderness.  Abdominal:     General: Bowel sounds are normal. There is no distension or abdominal bruit.     Palpations: Abdomen is soft. There is no mass.     Tenderness: There is no abdominal tenderness.  Hernia: No hernia is present.  Musculoskeletal:        General: No tenderness.     Right lower leg: No edema.     Left lower leg: No edema.  Lymphadenopathy:     Cervical: No cervical adenopathy.  Skin:    General: Skin is warm and dry.     Coloration: Skin is not pale.     Findings: No erythema or rash.     Comments: Solar lentigines diffusely   Neurological:     Mental Status: He is alert.     Cranial Nerves: No cranial nerve deficit.     Motor: No abnormal muscle tone.     Coordination: Coordination normal.     Gait: Gait normal.     Deep Tendon Reflexes: Reflexes are normal and symmetric. Reflexes normal.  Psychiatric:        Mood and Affect: Mood normal.        Cognition and Memory: Cognition and memory normal.           Assessment & Plan:   Problem List Items Addressed This Visit      Endocrine   Subclinical hypothyroidism    Lab Results  Component Value Date   TSH 5.23 (H) 06/29/2019    FT4 still in the normal range  No symptoms Will continue to monitor         Other   B12 deficiency    B12 level is down  Lab Results  Component Value Date   VITAMINB12 236 06/29/2019   Recommend otc B12 [phone] mcg daily  No symptoms currently      Routine general medical examination at a health care facility - Primary    Reviewed health habits including diet and exercise and skin cancer prevention Reviewed appropriate screening tests for age  Also reviewed health mt list, fam hx and  immunization status , as well as social and family history   See HPI Labs reviewed  Disc strategy for low cholesterol diet and handout given  Ref done for screening colonoscopy  Disc shingrix vaccine and he will check on coverage        Prostate cancer screening    No known family hx Lab Results  Component Value Date   PSA 0.38 06/29/2019   PSA 0.46 04/24/2018   PSA 0.47 03/19/2017    No voiding symptoms  Continue to follow      Erectile dysfunction    Trial of generic cialis  viagra gave him a headache  If side effect -will stop it       Colon cancer screening    Referral done for first screening colonoscopy        Relevant Orders   Ambulatory referral to Gastroenterology

## 2019-08-04 NOTE — Patient Instructions (Addendum)
If you are interested in the shingles vaccine series (Shingrix), call your insurance or pharmacy to check on coverage and location it must be given.  If affordable - you can schedule it here or at your pharmacy depending on coverage   Get back on oral vitamin B12  (500 to 1000 mcg daily)   Avoid red meat/ fried foods/ egg yolks/ fatty breakfast meats/ butter, cheese and high fat dairy/ and shellfish    The office will call you regarding a screening colonoscopy   Stay fit / eat healthy/get outdoors   Try the generic cialis  If you have side effects - stop it  Keep me posted

## 2019-08-04 NOTE — Assessment & Plan Note (Signed)
No known family hx Lab Results  Component Value Date   PSA 0.38 06/29/2019   PSA 0.46 04/24/2018   PSA 0.47 03/19/2017    No voiding symptoms  Continue to follow

## 2019-08-12 ENCOUNTER — Encounter: Payer: Self-pay | Admitting: Internal Medicine

## 2019-08-21 ENCOUNTER — Other Ambulatory Visit: Payer: Self-pay

## 2019-08-21 ENCOUNTER — Ambulatory Visit (AMBULATORY_SURGERY_CENTER): Payer: 59 | Admitting: *Deleted

## 2019-08-21 VITALS — Temp 98.6°F | Ht 67.0 in | Wt 180.0 lb

## 2019-08-21 DIAGNOSIS — Z1159 Encounter for screening for other viral diseases: Secondary | ICD-10-CM

## 2019-08-21 DIAGNOSIS — Z1211 Encounter for screening for malignant neoplasm of colon: Secondary | ICD-10-CM

## 2019-08-21 MED ORDER — NA SULFATE-K SULFATE-MG SULF 17.5-3.13-1.6 GM/177ML PO SOLN: 1.0000 | Freq: Once | ORAL | 0 refills | Status: AC

## 2019-08-21 NOTE — Progress Notes (Signed)

## 2019-08-24 ENCOUNTER — Encounter: Payer: Self-pay | Admitting: Internal Medicine

## 2019-08-27 ENCOUNTER — Ambulatory Visit (INDEPENDENT_AMBULATORY_CARE_PROVIDER_SITE_OTHER): Payer: 59

## 2019-08-27 DIAGNOSIS — Z1159 Encounter for screening for other viral diseases: Secondary | ICD-10-CM

## 2019-08-27 LAB — SARS CORONAVIRUS 2 (TAT 6-24 HRS): SARS Coronavirus 2: NEGATIVE

## 2019-08-31 ENCOUNTER — Ambulatory Visit (AMBULATORY_SURGERY_CENTER): Payer: 59 | Admitting: Internal Medicine

## 2019-08-31 ENCOUNTER — Other Ambulatory Visit: Payer: Self-pay

## 2019-08-31 ENCOUNTER — Encounter: Payer: Self-pay | Admitting: Internal Medicine

## 2019-08-31 VITALS — BP 111/70 | HR 63 | Temp 98.3°F | Resp 15 | Ht 67.25 in | Wt 180.0 lb

## 2019-08-31 DIAGNOSIS — Z1211 Encounter for screening for malignant neoplasm of colon: Secondary | ICD-10-CM

## 2019-08-31 MED ORDER — SODIUM CHLORIDE 0.9 % IV SOLN: 500.0000 mL | Freq: Once | INTRAVENOUS | Status: DC

## 2019-08-31 NOTE — Patient Instructions (Signed)
YOU HAD AN ENDOSCOPIC PROCEDURE TODAY AT [facility]:   Refer to the procedure report that was given to you for any specific questions about what was found during the examination.  If the procedure report does not answer your questions, please call your gastroenterologist to clarify.  If you requested that your care partner not be given the details of your procedure findings, then the procedure report has been included in a sealed envelope for you to review at your convenience later.  YOU SHOULD EXPECT: Some feelings of bloating in the abdomen. Passage of more gas than usual.  Walking can help get rid of the air that was put into your GI tract during the procedure and reduce the bloating. If you had a lower endoscopy (such as a colonoscopy or flexible sigmoidoscopy) you may notice spotting of blood in your stool or on the toilet paper. If you underwent a bowel prep for your procedure, you may not have a normal bowel movement for a few days.  Please Note:  You might notice some irritation and congestion in your nose or some drainage.  This is from the oxygen used during your procedure.  There is no need for concern and it should clear up in a day or so.  SYMPTOMS TO REPORT IMMEDIATELY:   Following lower endoscopy (colonoscopy or flexible sigmoidoscopy):  Excessive amounts of blood in the stool  Significant tenderness or worsening of abdominal pains  Swelling of the abdomen that is new, acute  Fever of 100F or higher  For urgent or emergent issues, a gastroenterologist can be reached at any hour by calling [phone].   DIET:  We do recommend a small meal at first, but then you may proceed to your regular diet.  Drink plenty of fluids but you should avoid alcoholic beverages for 24 hours.  ACTIVITY:  You should plan to take it easy for the rest of today and you should NOT DRIVE or use heavy machinery until tomorrow (because of the sedation medicines used during the test).     FOLLOW UP: Our staff will call the number listed on your records 48-72 hours following your procedure to check on you and address any questions or concerns that you may have regarding the information given to you following your procedure. If we do not reach you, we will leave a message.  We will attempt to reach you two times.  During this call, we will ask if you have developed any symptoms of COVID 19. If you develop any symptoms (ie: fever, flu-like symptoms, shortness of breath, cough etc.) before then, please call [phone].  If you test positive for Covid 19 in the 2 weeks post procedure, please call and report this information to [place].    If any biopsies were taken you will be contacted by phone or by letter within the next 1-3 weeks.  Please call us at [phone] if you have not heard about the biopsies in 3 weeks.    SIGNATURES/CONFIDENTIALITY: You and/or your care partner have signed paperwork which will be entered into your electronic medical record.  These signatures attest to the fact that that the information above on your After Visit Summary has been reviewed and is understood.  Full responsibility of the confidentiality of this discharge information lies with you and/or your care-partner.     You may resume your current medications today. Repeat colonoscopy in 10 years for screening exam. Please call if any questions or concerns.

## 2019-08-31 NOTE — Progress Notes (Signed)
Temp check by:JB Vital check by:DT  The patient states no changes in medical or surgical history since pre-visit screening on 08/21/2019.

## 2019-08-31 NOTE — Op Note (Signed)
[facility] Patient Name: Warren Hatfield Procedure Date: 08/31/2019 8:14 AM MRN: [id number] Endoscopist: Jerene Bears , MD Age: 53 Referring MD:  Date of Birth: 02-22-1966 Gender: Male Account #: [id number] Procedure:                Colonoscopy Indications:              Screening for colorectal malignant neoplasm, This                            is the patient's first colonoscopy Medicines:                Monitored Anesthesia Care Procedure:                Pre-Anesthesia Assessment:                           - Prior to the procedure, a History and Physical                            was performed, and patient medications and                            allergies were reviewed. The patient's tolerance of                            previous anesthesia was also reviewed. The risks                            and benefits of the procedure and the sedation                            options and risks were discussed with the patient.                            All questions were answered, and informed consent                            was obtained. Prior Anticoagulants: The patient has                            taken no previous anticoagulant or antiplatelet                            agents. ASA Grade Assessment: II - A patient with                            mild systemic disease. After reviewing the risks                            and benefits, the patient was deemed in                            satisfactory condition to undergo the procedure.  After obtaining informed consent, the colonoscope                            was passed under direct vision. Throughout the                            procedure, the patient's blood pressure, pulse, and                            oxygen saturations were monitored continuously. The                            Colonoscope was introduced through the anus and                            advanced to the cecum,  identified by appendiceal                            orifice and ileocecal valve. The colonoscopy was                            performed without difficulty. The patient tolerated                            the procedure well. The quality of the bowel                            preparation was excellent. The ileocecal valve,                            appendiceal orifice, and rectum were photographed. Scope In: 8:20:17 AM Scope Out: 8:31:21 AM Scope Withdrawal Time: 0 hours 8 minutes 59 seconds  Total Procedure Duration: 0 hours 11 minutes 4 seconds  Findings:                 The digital rectal exam was normal.                           The entire examined colon appeared normal on direct                            and retroflexion views. Complications:            No immediate complications. Estimated Blood Loss:     Estimated blood loss: none. Impression:               - The entire examined colon is normal on direct and                            retroflexion views.                           - No specimens collected. Recommendation:           - Patient has a contact number available for  emergencies. The signs and symptoms of potential                            delayed complications were discussed with the                            patient. Return to normal activities tomorrow.                            Written discharge instructions were provided to the                            patient.                           - Resume previous diet.                           - Continue present medications.                           - Repeat colonoscopy in 10 years for screening                            purposes. Jerene Bears, MD 08/31/2019 8:33:52 AM This report has been signed electronically.

## 2019-08-31 NOTE — Progress Notes (Signed)
No problems noted in the recovery room. maw 

## 2019-08-31 NOTE — Progress Notes (Signed)
To PACU, VSS. Report to [email or web address] 

## 2019-09-02 ENCOUNTER — Telehealth: Payer: Self-pay | Admitting: *Deleted

## 2019-09-02 NOTE — Telephone Encounter (Signed)
  Follow up Call-  Call back number 08/31/2019  Post procedure Call Back phone  # [phone]  Permission to leave phone message Yes  Some recent data might be hidden     Patient questions:  Do you have a fever, pain , or abdominal swelling? No. Pain Score  0 *  Have you tolerated food without any problems? Yes.    Have you been able to return to your normal activities? Yes.    Do you have any questions about your discharge instructions: Diet   No. Medications  No. Follow up visit  No.  Do you have questions or concerns about your Care? No.  Actions: * If pain score is 4 or above: No action needed, pain <4.

## 2019-09-02 NOTE — Telephone Encounter (Signed)
  Follow up Call-  Call back number 08/31/2019  Post procedure Call Back phone  # [phone]  Permission to leave phone message Yes  Some recent data might be hidden     Patient questions:  1. Have you developed a fever since your procedure? no  2.   Have you had an respiratory symptoms (SOB or cough) since your procedure? no  3.   Have you tested positive for COVID 19 since your procedure no  4.   Have you had any family members/close contacts diagnosed with the COVID 19 since your procedure?  No  See previous note.   If yes to any of these questions please route to Joylene John, RN and Alphonsa Gin, RN.

## 2020-06-10 ENCOUNTER — Telehealth: Payer: Self-pay | Admitting: Family Medicine

## 2020-06-10 NOTE — Telephone Encounter (Signed)
EKG received and given to PCP. Please review, work wanted pt to see PCP today but no appts in office at all today, please advise

## 2020-06-10 NOTE — Telephone Encounter (Signed)
I reviewed his EKG- the fax is very light but I do not see signs of a problem myself (despite being read as poss inf infarct)   Please ask if any cp/sob and let me know  Please schedule a f/u so I can repeat EKG and get a better quality reading  I rev his last EKG in chart- do not see significant change Reassuring

## 2020-06-10 NOTE — Telephone Encounter (Signed)
Pt called he had his cpx through work and they wanted someone to look at his ekg  It come up inferior MI  Different from last year.   No chest pain sob.  They are going to fax EKG to [phone]

## 2020-06-10 NOTE — Telephone Encounter (Signed)
Pt notified of Dr. Marliss Coots comments. Pt isn't having any sxs at all. Appt scheduled Monday at 9am

## 2020-06-13 ENCOUNTER — Other Ambulatory Visit: Payer: Self-pay

## 2020-06-13 ENCOUNTER — Ambulatory Visit (INDEPENDENT_AMBULATORY_CARE_PROVIDER_SITE_OTHER): Payer: 59 | Admitting: Family Medicine

## 2020-06-13 ENCOUNTER — Encounter: Payer: Self-pay | Admitting: Family Medicine

## 2020-06-13 VITALS — BP 124/68 | HR 79 | Temp 97.8°F | Ht 67.25 in | Wt 174.4 lb

## 2020-06-13 DIAGNOSIS — E038 Other specified hypothyroidism: Secondary | ICD-10-CM | POA: Diagnosis not present

## 2020-06-13 DIAGNOSIS — E7800 Pure hypercholesterolemia, unspecified: Secondary | ICD-10-CM | POA: Diagnosis not present

## 2020-06-13 DIAGNOSIS — E785 Hyperlipidemia, unspecified: Secondary | ICD-10-CM | POA: Insufficient documentation

## 2020-06-13 DIAGNOSIS — R9431 Abnormal electrocardiogram [ECG] [EKG]: Secondary | ICD-10-CM

## 2020-06-13 MED ORDER — ROSUVASTATIN CALCIUM 5 MG PO TABS: 5.0000 mg | ORAL_TABLET | Freq: Every day | ORAL | 3 refills | Status: DC

## 2020-06-13 NOTE — Assessment & Plan Note (Signed)
Disc goals for lipids and reasons to control them Rev last labs with pt Rev low sat fat diet in detail LDL is up to 139 and with family hx of early CAD I recommend trial of statin  crestor 5 mg px Re check labs in 6 wk  Diet and exercise are already good  EKG here is without acute change -reassuring

## 2020-06-13 NOTE — Assessment & Plan Note (Signed)
TSH was mildly elevated at work again  [employer] T4 here in the past and no symptoms Will continue to watch  Will f/u in 6 mo for PE

## 2020-06-13 NOTE — Assessment & Plan Note (Signed)
ekg from his work ([employer]) health exam was read as probable inf infarct by computer  Today's EKG is within nl limits with nsr rate 75  RSR in V1 (suspect not significant) No cardiovasc symptoms Has fam hx of CAD  He has high cholesterol-wll start statin  F/u in 6 mo  Offered cardiol ref at any time to discuss risk factors

## 2020-06-13 NOTE — Patient Instructions (Signed)
Consider giving up cigars   Start generic crestor 5 mg daily  If any side effects let us know   Exercise Eat well  Use moderation with alcohol   Labs- 6 weeks   Physical 6 months

## 2020-06-13 NOTE — Progress Notes (Signed)
Subjective:    Patient ID: Warren Hatfield, male    DOB: May 03, 1966, 54 y.o.   MRN: [id number]  This visit occurred during the SARS-CoV-2 public health emergency.  Safety protocols were in place, including screening questions prior to the visit, additional usage of staff PPE, and extensive cleaning of exam room while observing appropriate contact time as indicated for disinfecting solutions.    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   Wt Readings from Last 3 Encounters:  06/13/20 174 lb 6 oz (79.1 kg)  08/31/19 180 lb (81.6 kg)  08/21/19 180 lb (81.6 kg)   27.11 kg/m  Had EKG for work Read by computer as probable inf infarct   Here, EKG NSR rate of 75 with RSR in V1  No changes from prior   BP Readings from Last 3 Encounters:  06/13/20 124/68  08/31/19 111/70  08/04/19 124/62   Pulse Readings from Last 3 Encounters:  06/13/20 79  08/31/19 63  08/04/19 80   No symptoms at all  No chest pain/jaw pain /arm pain  No sob with exercise -feels fine   Weeding/mowing all day sat-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   occ cigar smoker (once per month)  Father had MI - at 39   Last lipid panel here Lab Results  Component Value Date   CHOL 171 06/29/2019   HDL 43.10 06/29/2019   LDLCALC 118 (H) 06/29/2019   TRIG 49.0 06/29/2019   CHOLHDL 4 06/29/2019   At work LDL was 139 and HDL 44  Glucose 95  Diet had not changed before most recent labs  Eats well  No fast food  Salads/chicken/ beans  Red meat - does eat more than twice per month  occ fried chicken-not often   Does prioritize exercise    Lab Results  Component Value Date   TSH 5.23 (H) 06/29/2019   free T4 in nl range  TSH as work 4.9 H/o subclinical hypothyroid  Patient Active Problem List   Diagnosis Date Noted  . Hyperlipidemia 06/13/2020  . Abnormal EKG 06/13/2020  . Colon cancer screening 04/30/2018  . Erectile dysfunction 03/25/2017  . Subclinical hypothyroidism 03/25/2017  . Prostate cancer  screening 03/17/2017  . Eczema 11/18/2015  . Screening for lipoid disorders 05/10/2015  . Routine general medical examination at a health care facility 12/22/2010  . B12 deficiency 11/16/2009   Past Medical History:  Diagnosis Date  . Elevated TSH   . Hip pain   . Stress reaction    Past Surgical History:  Procedure Laterality Date  . HAND SURGERY  1998   plate nd screws in 3rd finger of right hand following fx  . LASIK  1993   Both eyes  . TOTAL HIP ARTHROPLASTY  02/11  . VASECTOMY  02/08   Social History   Tobacco Use  . Smoking status: Light Tobacco Smoker    Types: Cigars  . Smokeless tobacco: Never Used  Vaping Use  . Vaping Use: Never used  Substance Use Topics  . Alcohol use: Yes    Alcohol/week: 0.0 standard drinks    Comment: 1-2 drinks per month maximum  . Drug use: No   Family History  Problem Relation Age of Onset  . Heart disease Father        MI  . Hypertension Father   . Hypothyroidism Mother   . Diabetes Maternal Grandfather   . Diabetes Paternal Grandmother   . Colon cancer Neg Hx   . Esophageal  cancer Neg Hx   . Rectal cancer Neg Hx   . Stomach cancer Neg Hx    Allergies  Allergen Reactions  . Oxycodone-Acetaminophen Nausea Only  . Sildenafil     Pt stated, "It gave me a headache"   No current outpatient medications on file prior to visit.   No current facility-administered medications on file prior to visit.    Review of Systems  Constitutional: Negative for activity change, appetite change, fatigue, fever and unexpected weight change.  HENT: Negative for congestion, rhinorrhea, sore throat and trouble swallowing.   Eyes: Negative for pain, redness, itching and visual disturbance.  Respiratory: Negative for cough, chest tightness, shortness of breath and wheezing.   Cardiovascular: Negative for chest pain and palpitations.  Gastrointestinal: Negative for abdominal pain, blood in stool, constipation, diarrhea and nausea.  Endocrine:  Negative for cold intolerance, heat intolerance, polydipsia and polyuria.  Genitourinary: Negative for difficulty urinating, dysuria, frequency and urgency.  Musculoskeletal: Negative for arthralgias, joint swelling and myalgias.  Skin: Negative for pallor and rash.  Neurological: Negative for dizziness, tremors, weakness, numbness and headaches.  Hematological: Negative for adenopathy. Does not bruise/bleed easily.  Psychiatric/Behavioral: Negative for decreased concentration and dysphoric mood. The patient is not nervous/anxious.        Objective:   Physical Exam Constitutional:      General: He is not in acute distress.    Appearance: Normal appearance. He is well-developed and normal weight. He is not ill-appearing or diaphoretic.  HENT:     Head: Normocephalic and atraumatic.  Eyes:     General: No scleral icterus.    Conjunctiva/sclera: Conjunctivae normal.     Pupils: Pupils are equal, round, and reactive to light.  Neck:     Thyroid: No thyromegaly.     Vascular: No carotid bruit or JVD.  Cardiovascular:     Rate and Rhythm: Normal rate and regular rhythm.     Pulses: Normal pulses.     Heart sounds: Normal heart sounds. No gallop.   Pulmonary:     Effort: Pulmonary effort is normal. No respiratory distress.     Breath sounds: Normal breath sounds. No wheezing or rales.  Abdominal:     General: Bowel sounds are normal. There is no distension or abdominal bruit.     Palpations: Abdomen is soft. There is no mass.     Tenderness: There is no abdominal tenderness.  Musculoskeletal:     Cervical back: Normal range of motion and neck supple.     Right lower leg: No edema.     Left lower leg: No edema.  Lymphadenopathy:     Cervical: No cervical adenopathy.  Skin:    General: Skin is warm and dry.     Coloration: Skin is not pale.     Findings: No erythema or rash.  Neurological:     Mental Status: He is alert.     Coordination: Coordination normal.     Deep Tendon  Reflexes: Reflexes are normal and symmetric. Reflexes normal.  Psychiatric:        Mood and Affect: Mood normal.           Assessment & Plan:   Problem List Items Addressed This Visit      Endocrine   Subclinical hypothyroidism    TSH was mildly elevated at work again  [employer] T4 here in the past and no symptoms Will continue to watch  Will f/u in 6 mo for PE  Other   Hyperlipidemia - Primary    Disc goals for lipids and reasons to control them Rev last labs with pt Rev low sat fat diet in detail LDL is up to 139 and with family hx of early CAD I recommend trial of statin  crestor 5 mg px Re check labs in 6 wk  Diet and exercise are already good  EKG here is without acute change -reassuring       Relevant Medications   rosuvastatin (CRESTOR) 5 MG tablet   Abnormal EKG    ekg from his work ([employer]) health exam was read as probable inf infarct by computer  Today's EKG is within nl limits with nsr rate 75  RSR in V1 (suspect not significant) No cardiovasc symptoms Has fam hx of CAD  He has high cholesterol-wll start statin  F/u in 6 mo  Offered cardiol ref at any time to discuss risk factors        Other Visit Diagnoses    Abnormal finding on EKG       Relevant Orders   EKG 12-Lead (Completed)

## 2020-07-23 ENCOUNTER — Telehealth: Payer: Self-pay | Admitting: Family Medicine

## 2020-07-23 DIAGNOSIS — E7800 Pure hypercholesterolemia, unspecified: Secondary | ICD-10-CM

## 2020-07-23 DIAGNOSIS — Z125 Encounter for screening for malignant neoplasm of prostate: Secondary | ICD-10-CM

## 2020-07-23 DIAGNOSIS — E038 Other specified hypothyroidism: Secondary | ICD-10-CM

## 2020-07-23 DIAGNOSIS — E538 Deficiency of other specified B group vitamins: Secondary | ICD-10-CM

## 2020-07-23 DIAGNOSIS — Z0000 Encounter for general adult medical examination without abnormal findings: Secondary | ICD-10-CM

## 2020-07-23 NOTE — Telephone Encounter (Signed)
-----   Message from Ellamae Sia sent at 07/14/2020  3:08 PM EDT ----- Regarding: lab orders for Monday, 11.15.21 Lab orders for a 6 month follow up appt

## 2020-07-25 ENCOUNTER — Other Ambulatory Visit: Payer: Self-pay

## 2020-07-25 ENCOUNTER — Other Ambulatory Visit (INDEPENDENT_AMBULATORY_CARE_PROVIDER_SITE_OTHER): Payer: 59

## 2020-07-25 DIAGNOSIS — E038 Other specified hypothyroidism: Secondary | ICD-10-CM

## 2020-07-25 DIAGNOSIS — Z0000 Encounter for general adult medical examination without abnormal findings: Secondary | ICD-10-CM

## 2020-07-25 DIAGNOSIS — E538 Deficiency of other specified B group vitamins: Secondary | ICD-10-CM | POA: Diagnosis not present

## 2020-07-25 DIAGNOSIS — E7800 Pure hypercholesterolemia, unspecified: Secondary | ICD-10-CM

## 2020-07-25 DIAGNOSIS — Z125 Encounter for screening for malignant neoplasm of prostate: Secondary | ICD-10-CM | POA: Diagnosis not present

## 2020-07-25 LAB — COMPREHENSIVE METABOLIC PANEL
ALT: 25 U/L (ref 0–53)
AST: 26 U/L (ref 0–37)
Albumin: 4.3 g/dL (ref 3.5–5.2)
Alkaline Phosphatase: 61 U/L (ref 39–117)
BUN: 14 mg/dL (ref 6–23)
CO2: 29 mEq/L (ref 19–32)
Calcium: 9.2 mg/dL (ref 8.4–10.5)
Chloride: 103 mEq/L (ref 96–112)
Creatinine, Ser: 1.09 mg/dL (ref 0.40–1.50)
GFR: 77.96 mL/min (ref >60.00)
Glucose, Bld: 89 mg/dL (ref 70–99)
Potassium: 4.3 mEq/L (ref 3.5–5.1)
Sodium: 139 mEq/L (ref 135–145)
Total Bilirubin: 0.6 mg/dL (ref 0.2–1.2)
Total Protein: 6.4 g/dL (ref 6.0–8.3)

## 2020-07-25 LAB — LIPID PANEL
Cholesterol: 129 mg/dL (ref 0–200)
HDL: 46.4 mg/dL (ref >39.00)
LDL Cholesterol: 75 mg/dL (ref 0–99)
NonHDL: 82.68
Total CHOL/HDL Ratio: 3
Triglycerides: 40 mg/dL (ref 0.0–149.0)
VLDL: 8 mg/dL (ref 0.0–40.0)

## 2020-07-25 LAB — CBC WITH DIFFERENTIAL/PLATELET
Basophils Absolute: 0 10*3/uL (ref 0.0–0.1)
Basophils Relative: 0.8 % (ref 0.0–3.0)
Eosinophils Absolute: 0.2 10*3/uL (ref 0.0–0.7)
Eosinophils Relative: 3.9 % (ref 0.0–5.0)
HCT: 41.8 % (ref 39.0–52.0)
Hemoglobin: 14.2 g/dL (ref 13.0–17.0)
Lymphocytes Relative: 28.9 % (ref 12.0–46.0)
Lymphs Abs: 1.6 10*3/uL (ref 0.7–4.0)
MCHC: 34 g/dL (ref 30.0–36.0)
MCV: 90.5 fl (ref 78.0–100.0)
Monocytes Absolute: 0.4 10*3/uL (ref 0.1–1.0)
Monocytes Relative: 7.8 % (ref 3.0–12.0)
Neutro Abs: 3.3 10*3/uL (ref 1.4–7.7)
Neutrophils Relative %: 58.6 % (ref 43.0–77.0)
Platelets: 209 10*3/uL (ref 150.0–400.0)
RBC: 4.62 Mil/uL (ref 4.22–5.81)
RDW: 13.1 % (ref 11.5–15.5)
WBC: 5.7 10*3/uL (ref 4.0–10.5)

## 2020-07-25 LAB — TSH: TSH: 3.91 u[IU]/mL (ref 0.35–4.50)

## 2020-07-25 LAB — T4, FREE: Free T4: 0.83 ng/dL (ref 0.60–1.60)

## 2020-07-25 LAB — PSA: PSA: 0.28 ng/mL (ref 0.10–4.00)

## 2020-07-25 LAB — VITAMIN B12: Vitamin B-12: 587 pg/mL (ref 211–911)

## 2020-07-26 ENCOUNTER — Encounter: Payer: Self-pay | Admitting: *Deleted

## 2020-12-13 ENCOUNTER — Ambulatory Visit (INDEPENDENT_AMBULATORY_CARE_PROVIDER_SITE_OTHER): Payer: 59 | Admitting: Family Medicine

## 2020-12-13 ENCOUNTER — Other Ambulatory Visit: Payer: Self-pay

## 2020-12-13 ENCOUNTER — Encounter: Payer: Self-pay | Admitting: Family Medicine

## 2020-12-13 VITALS — BP 114/62 | HR 76 | Temp 96.8°F | Ht 67.5 in | Wt 175.4 lb

## 2020-12-13 DIAGNOSIS — E7800 Pure hypercholesterolemia, unspecified: Secondary | ICD-10-CM | POA: Diagnosis not present

## 2020-12-13 DIAGNOSIS — Z0000 Encounter for general adult medical examination without abnormal findings: Secondary | ICD-10-CM

## 2020-12-13 DIAGNOSIS — Z125 Encounter for screening for malignant neoplasm of prostate: Secondary | ICD-10-CM

## 2020-12-13 DIAGNOSIS — Z8249 Family history of ischemic heart disease and other diseases of the circulatory system: Secondary | ICD-10-CM

## 2020-12-13 DIAGNOSIS — E038 Other specified hypothyroidism: Secondary | ICD-10-CM | POA: Diagnosis not present

## 2020-12-13 DIAGNOSIS — E538 Deficiency of other specified B group vitamins: Secondary | ICD-10-CM

## 2020-12-13 DIAGNOSIS — Z1211 Encounter for screening for malignant neoplasm of colon: Secondary | ICD-10-CM

## 2020-12-13 DIAGNOSIS — L309 Dermatitis, unspecified: Secondary | ICD-10-CM

## 2020-12-13 MED ORDER — CLOBETASOL PROPIONATE 0.05 % EX CREA: 1.0000 "application " | TOPICAL_CREAM | Freq: Two times a day (BID) | CUTANEOUS | 1 refills | Status: DC

## 2020-12-13 NOTE — Assessment & Plan Note (Signed)
TSH and FT4 are in the normal range this draw Will continue to monitor

## 2020-12-13 NOTE — Assessment & Plan Note (Signed)
Reviewed health habits including diet and exercise and skin cancer prevention Reviewed appropriate screening tests for age  Also reviewed health mt list, fam hx and immunization status , as well as social and family history   See HPI Labs reviewed  psa stable Colonoscopy utd covid vaccinated with booster Interested in shingrix vaccine if covered  Discussed cardiac risk factors and family hx

## 2020-12-13 NOTE — Progress Notes (Signed)
Subjective:    Patient ID: Warren Hatfield, male    DOB: 1966/05/10, 55 y.o.   MRN: [id number]  This visit occurred during the SARS-CoV-2 public health emergency.  Safety protocols were in place, including screening questions prior to the visit, additional usage of staff PPE, and extensive cleaning of exam room while observing appropriate contact time as indicated for disinfecting solutions.    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   No time for self care  Has a 53 year old   covid vaccinated with booster   Tdap 7/18 Zoster status -interested in shingrix   Colonoscopy 12/20  Prostate health Lab Results  Component Value Date   PSA 0.28 07/25/2020   PSA 0.38 06/29/2019   PSA 0.46 04/24/2018   Some nocturia - once usually /occ twice No trouble emptying   BP Readings from Last 3 Encounters:  12/13/20 114/62  06/13/20 124/68  08/31/19 111/70   Pulse Readings from Last 3 Encounters:  12/13/20 76  06/13/20 79  08/31/19 63   Not as much exercise as he needs  So busy at work and home    Subclinical hypothyroidism Lab Results  Component Value Date   TSH 3.91 07/25/2020   free T4 nl at 0.83  Hyperlipidemia Lab Results  Component Value Date   CHOL 129 07/25/2020   CHOL 171 06/29/2019   CHOL 178 04/24/2018   Lab Results  Component Value Date   HDL 46.40 07/25/2020   HDL 43.10 06/29/2019   HDL 49.30 04/24/2018   Lab Results  Component Value Date   LDLCALC 75 07/25/2020   LDLCALC 118 (H) 06/29/2019   LDLCALC 119 (H) 04/24/2018   Lab Results  Component Value Date   TRIG 40.0 07/25/2020   TRIG 49.0 06/29/2019   TRIG 50.0 04/24/2018   Lab Results  Component Value Date   CHOLHDL 3  07/25/2020   CHOLHDL 4 06/29/2019   CHOLHDL 4 04/24/2018   No results found for: LDLDIRECT Taking crestor 5 mg daily   Vit B12 def Lab Results  Component Value Date   ENIDPOEU23 536 07/25/2020  takes mvi daily  Eats a balanced diet   Lab Results  Component Value Date   WBC 5.7 07/25/2020   HGB 14.2 07/25/2020   HCT 41.8 07/25/2020   MCV 90.5 07/25/2020   PLT 209.0 07/25/2020   Lab Results  Component Value Date   CREATININE 1.09 07/25/2020   BUN 14 07/25/2020   NA 139 07/25/2020   K 4.3 07/25/2020   CL 103 07/25/2020   CO2 29 07/25/2020   Lab Results  Component Value Date   ALT 25 07/25/2020   AST 26 07/25/2020   ALKPHOS 61 07/25/2020   BILITOT 0.6 07/25/2020   Was using clobetasol 0.5% for eczema on upper legs  Family history of CAD -father died at 48 Would like a referral   Patient Active Problem List   Diagnosis Date Noted  . Family history of early CAD 12/13/2020  . Hyperlipidemia 06/13/2020  . Abnormal EKG 06/13/2020  . Colon cancer screening 04/30/2018  . Erectile dysfunction 03/25/2017  . Subclinical hypothyroidism 03/25/2017  . Prostate  cancer screening 03/17/2017  . Eczema 11/18/2015  . Routine general medical examination at a health care facility 12/22/2010  . B12 deficiency 11/16/2009   Past Medical History:  Diagnosis Date  . Elevated TSH   . Hip pain   . Stress reaction    Past Surgical History:  Procedure Laterality Date  . HAND SURGERY  1998   plate nd screws in 3rd finger of right hand following fx  . LASIK  1993   Both eyes  . TOTAL HIP ARTHROPLASTY  02/11  . VASECTOMY  02/08   Social History   Tobacco Use  . Smoking status: Former Smoker    Types: Cigars  . Smokeless tobacco: Never Used  Vaping Use  . Vaping Use: Never used  Substance Use Topics  . Alcohol use: Yes    Alcohol/week: 0.0 standard drinks    Comment: 1-2 drinks per month maximum  . Drug use: No   Family History  Problem Relation Age of Onset  . Heart  disease Father        MI  . Hypertension Father   . Hypothyroidism Mother   . Breast cancer Mother   . Diabetes Maternal Grandfather   . Diabetes Paternal Grandmother   . Colon cancer Neg Hx   . Esophageal cancer Neg Hx   . Rectal cancer Neg Hx   . Stomach cancer Neg Hx    Allergies  Allergen Reactions  . Oxycodone-Acetaminophen Nausea Only  . Sildenafil     Pt stated, "It gave me a headache"   Current Outpatient Medications on File Prior to Visit  Medication Sig Dispense Refill  . rosuvastatin (CRESTOR) 5 MG tablet Take 1 tablet (5 mg total) by mouth daily. 90 tablet 3   No current facility-administered medications on file prior to visit.     Review of Systems  Constitutional: Positive for fatigue. Negative for activity change, appetite change, fever and unexpected weight change.       Difficult schedule causing fatigue   HENT: Negative for congestion, rhinorrhea, sore throat and trouble swallowing.   Eyes: Negative for pain, redness, itching and visual disturbance.  Respiratory: Negative for cough, chest tightness, shortness of breath and wheezing.   Cardiovascular: Negative for chest pain and palpitations.  Gastrointestinal: Negative for abdominal pain, blood in stool, constipation, diarrhea and nausea.  Endocrine: Negative for cold intolerance, heat intolerance, polydipsia and polyuria.  Genitourinary: Negative for difficulty urinating, dysuria, frequency and urgency.  Musculoskeletal: Negative for arthralgias, joint swelling and myalgias.  Skin: Negative for pallor and rash.  Neurological: Negative for dizziness, tremors, weakness, numbness and headaches.  Hematological: Negative for adenopathy. Does not bruise/bleed easily.  Psychiatric/Behavioral: Negative for decreased concentration and dysphoric mood. The patient is not nervous/anxious.        Objective:   Physical Exam Constitutional:      General: He is not in acute distress.    Appearance: Normal appearance.  He is well-developed and normal weight. He is not ill-appearing or diaphoretic.  HENT:     Head: Normocephalic and atraumatic.     Right Ear: Tympanic membrane, ear canal and external ear normal.     Left Ear: Tympanic membrane, ear canal and external ear normal.     Nose: Nose normal. No congestion.     Mouth/Throat:     Mouth: Mucous membranes are moist.     Pharynx: Oropharynx is clear. No posterior oropharyngeal erythema.  Eyes:     General: No scleral icterus.  Right eye: No discharge.        Left eye: No discharge.     Conjunctiva/sclera: Conjunctivae normal.     Pupils: Pupils are equal, round, and reactive to light.  Neck:     Thyroid: No thyromegaly.     Vascular: No carotid bruit or JVD.  Cardiovascular:     Rate and Rhythm: Normal rate and regular rhythm.     Pulses: Normal pulses.     Heart sounds: Normal heart sounds. No gallop.   Pulmonary:     Effort: Pulmonary effort is normal. No respiratory distress.     Breath sounds: Normal breath sounds. No wheezing or rales.     Comments: Good air exch Chest:     Chest wall: No tenderness.  Abdominal:     General: Bowel sounds are normal. There is no distension or abdominal bruit.     Palpations: Abdomen is soft. There is no mass.     Tenderness: There is no abdominal tenderness.     Hernia: No hernia is present.  Musculoskeletal:        General: No tenderness.     Cervical back: Normal range of motion and neck supple. No rigidity. No muscular tenderness.     Right lower leg: No edema.     Left lower leg: No edema.  Lymphadenopathy:     Cervical: No cervical adenopathy.  Skin:    General: Skin is warm and dry.     Coloration: Skin is not pale.     Findings: No erythema or rash.     Comments: Solar lentigines diffusely   Neurological:     Mental Status: He is alert.     Cranial Nerves: No cranial nerve deficit.     Motor: No abnormal muscle tone.     Coordination: Coordination normal.     Gait: Gait normal.      Deep Tendon Reflexes: Reflexes are normal and symmetric. Reflexes normal.  Psychiatric:        Mood and Affect: Mood normal.        Cognition and Memory: Cognition and memory normal.           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 Reminded to avoid heat and harsh detergents and to continue moisturizing        Other   B12 deficiency    Lab Results  Component Value Date   VITAMINB12 587 07/25/2020   Well supplemented with mvi daily  Enc to continue      Routine general medical examination at a health care facility - Primary    Reviewed health habits including diet and exercise and skin cancer prevention Reviewed appropriate screening tests for age  Also reviewed health mt list, fam hx and immunization status , as well as social and family history   See HPI Labs reviewed  psa stable Colonoscopy utd covid vaccinated with booster Interested in shingrix vaccine if covered  Discussed cardiac risk factors and family hx      Prostate cancer screening    Lab Results  Component Value Date   PSA 0.28 07/25/2020   PSA  0.38 06/29/2019   PSA 0.46 04/24/2018   No voiding symptoms  Nocturia times oen baseline, no trouble emptying        Colon cancer screening    Colonoscopy utd from 12/20      Hyperlipidemia    Disc goals for lipids and reasons to control them Rev last labs with pt Rev low sat fat diet in detail Good control with crestor 5 mg daily and diet  LDL of 75  Noted family hx of early cardiac dz      Family history of early CAD    Father died at 55 with cardiac death  Pt has good bp and good lipid control with statin  No angina symptoms  Disc imp of  healthy lifestyle  Ref done to cardiology to discuss further       Relevant Orders   Ambulatory referral to Cardiology

## 2020-12-13 NOTE — Assessment & Plan Note (Signed)
Disc goals for lipids and reasons to control them Rev last labs with pt Rev low sat fat diet in detail Good control with crestor 5 mg daily and diet  LDL of 75  Noted family hx of early cardiac dz

## 2020-12-13 NOTE — Assessment & Plan Note (Signed)
Colonoscopy utd from 12/20

## 2020-12-13 NOTE — Assessment & Plan Note (Addendum)
Father died at 41 with cardiac death  Pt has good bp and good lipid control with statin  No angina symptoms  Disc imp of healthy lifestyle  Ref done to cardiology to discuss further

## 2020-12-13 NOTE — Patient Instructions (Addendum)
If you are interested in the shingles vaccine series (Shingrix), call your insurance or pharmacy to check on coverage and location it must be given.  If affordable - you can schedule it here or at your pharmacy depending on coverage   I sent your clobetasol to your pharmacy  Use small amount only as needed   Use moisturizers as well   Fit in exercise when you can   The office will call you to set up a cardiology visit

## 2020-12-13 NOTE — Assessment & Plan Note (Signed)
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

## 2020-12-13 NOTE — Assessment & Plan Note (Signed)
Lab Results  Component Value Date   PSA 0.28 07/25/2020   PSA 0.38 06/29/2019   PSA 0.46 04/24/2018   No voiding symptoms  Nocturia times oen baseline, no trouble emptying

## 2020-12-13 NOTE — Assessment & Plan Note (Signed)
Lab Results  Component Value Date   LEXNTZGY17 494 07/25/2020   Well supplemented with mvi daily  Enc to continue

## 2021-01-26 ENCOUNTER — Ambulatory Visit: Payer: 59 | Admitting: Cardiology

## 2021-02-15 ENCOUNTER — Ambulatory Visit: Payer: 59 | Admitting: Interventional Cardiology

## 2021-04-20 ENCOUNTER — Ambulatory Visit: Payer: 59 | Admitting: Interventional Cardiology

## 2021-07-03 ENCOUNTER — Other Ambulatory Visit: Payer: Self-pay

## 2021-07-03 ENCOUNTER — Ambulatory Visit: Payer: 59 | Admitting: Interventional Cardiology

## 2021-07-03 ENCOUNTER — Encounter: Payer: Self-pay | Admitting: Interventional Cardiology

## 2021-07-03 VITALS — BP 122/68 | HR 71 | Ht 67.5 in | Wt 181.9 lb

## 2021-07-03 DIAGNOSIS — Z8249 Family history of ischemic heart disease and other diseases of the circulatory system: Secondary | ICD-10-CM | POA: Diagnosis not present

## 2021-07-03 DIAGNOSIS — E782 Mixed hyperlipidemia: Secondary | ICD-10-CM

## 2021-07-03 DIAGNOSIS — R9431 Abnormal electrocardiogram [ECG] [EKG]: Secondary | ICD-10-CM | POA: Diagnosis not present

## 2021-07-03 NOTE — Patient Instructions (Signed)
Medication Instructions:  Your physician recommends that you continue on your current medications as directed. Please refer to the Current Medication list given to you today.  *If you need a refill on your cardiac medications before your next appointment, please call your pharmacy*   Lab Work: NONE If you have labs (blood work) drawn today and your tests are completely normal, you will receive your results only by: [place] (if you have MyChart) OR A paper copy in the mail If you have any lab test that is abnormal or we need to change your treatment, we will call you to review the results.   Testing/Procedures: Your physician has requested that you have a Coronary Calcium Score Test.    Follow-Up: Based on results  At [facility], you and your health needs are our priority.  As part of our continuing mission to provide you with exceptional heart care, we have created designated Provider Care Teams.  These Care Teams include your primary Cardiologist (physician) and Advanced Practice Providers (APPs -  Physician Assistants and Nurse Practitioners) who all work together to provide you with the care you need, when you need it.  We recommend signing up for the patient portal called "MyChart".  Sign up information is provided on this After Visit Summary.  MyChart is used to connect with patients for Virtual Visits (Telemedicine).  Patients are able to view lab/test results, encounter notes, upcoming appointments, etc.  Non-urgent messages can be sent to your provider as well.   To learn more about what you can do with MyChart, go to [email or web address].

## 2021-07-03 NOTE — Progress Notes (Signed)
Cardiology Office Note   Date:  07/03/2021   ID:  Warren Hatfield, DOB 19-Jun-1966, MRN [id number]  PCP:  Abner Greenspan, MD    No chief complaint on file.  Abnormal ECG  Wt Readings from Last 3 Encounters:  07/03/21 181 lb 14.4 oz (82.5 kg)  12/13/20 175 lb 6 oz (79.5 kg)  06/13/20 174 lb 6 oz (79.1 kg)       History of Present Illness: Warren Hatfield is a 55 y.o. male who is being seen today for the evaluation of abnormal ECG at the request of Tower, Wynelle Fanny, MD.   ECG done at work showed an abnormality.  Due to family history of heart disease, he was referred here for further eval.  Father had MI at 59 y/o.   He avoids fast food.  Works as a Engineer, structural.  Not much walking on the job.  Denies : Chest pain. Dizziness. Leg edema. Nitroglycerin use. Orthopnea. Palpitations. Paroxysmal nocturnal dyspnea. Shortness of breath. Syncope.      Past Medical History:  Diagnosis Date   Abnormal EKG    B12 deficiency    Eczema    ED (erectile dysfunction)    Elevated TSH    Hip pain    Hyperlipidemia    Stress reaction    Subclinical hypothyroidism     Past Surgical History:  Procedure Laterality Date   HAND SURGERY  1998   plate nd screws in 3rd finger of right hand following fx   [place]  02/11   VASECTOMY  02/08     Current Outpatient Medications  Medication Sig Dispense Refill   clobetasol cream (TEMOVATE) 8.29 % Apply 1 application topically 2 (two) times daily. 30 g 1   rosuvastatin (CRESTOR) 5 MG tablet Take 1 tablet (5 mg total) by mouth daily. 90 tablet 3   No current facility-administered medications for this visit.    Allergies:   Oxycodone-acetaminophen and Sildenafil    Social History:  The patient  reports that he has quit smoking. His smoking use included cigars. He has never used smokeless tobacco. He reports current alcohol use. He reports that he does not use drugs.   Family History:  The  patient's family history includes Breast cancer in his mother; Diabetes in his maternal grandfather and paternal grandmother; Heart disease in his father; Hypertension in his father; Hypothyroidism in his mother.    ROS:  Please see the history of present illness.   Otherwise, review of systems are positive for rare cigar use.   All other systems are reviewed and negative.    PHYSICAL EXAM: VS:  BP 122/68   Pulse 71   Ht 5' 7.5" (1.715 m)   Wt 181 lb 14.4 oz (82.5 kg)   SpO2 96%   BMI 28.07 kg/m  , BMI Body mass index is 28.07 kg/m. GEN: Well nourished, well developed, in no acute distress HEENT: normal Neck: no JVD, carotid bruits, or masses Cardiac: RRR; no murmurs, rubs, or gallops,no edema  Respiratory:  clear to auscultation bilaterally, normal work of breathing GI: soft, nontender, nondistended, + BS MS: no deformity or atrophy Skin: warm and dry, no rash Neuro:  Strength and sensation are intact Psych: euthymic mood, full affect   EKG:   The ekg ordered today demonstrates normal sinus rhythm, no ST segment changes, isolated Q-wave in lead III, T wave inversions in lead III.  No  clear sign of old MI.   Recent Labs: 07/25/2020: ALT 25; BUN 14; Creatinine, Ser 1.09; Hemoglobin 14.2; Platelets 209.0; Potassium 4.3; Sodium 139; TSH 3.91   Lipid Panel    Component Value Date/Time   CHOL 129 07/25/2020 0845   TRIG 40.0 07/25/2020 0845   HDL 46.40 07/25/2020 0845   CHOLHDL 3 07/25/2020 0845   VLDL 8.0 07/25/2020 0845   LDLCALC 75 07/25/2020 0845     Other studies Reviewed: Additional studies/ records that were reviewed today with results demonstrating: labs reviewed.   ASSESSMENT AND PLAN:  Family h/o early CAD: Father with MI at an early age.  Healthy lifestyle measures discussed.  Hyperlipidemia: LDL 75 in 2021 on Rosuvastatin 5 mg daily.  Whole food, plant-based diet recommended. Abnormal ECG: isolated Q wave in lead 3.  ECG is similar to prior. Plan for  calcium scoring CT. whole food, plant-based diet.  Avoid processed food.  Discussed inflammation as well.  Continue statin.  May need to adjust the dose based on his calcium score. Tobacco abuse: He rarely smokes cigars.   Current medicines are reviewed at length with the patient today.  The patient concerns regarding his medicines were addressed.  The following changes have been made:  No change  Labs/ tests ordered today include:  No orders of the defined types were placed in this encounter.   Recommend 150 minutes/week of aerobic exercise Low fat, low carb, high fiber diet recommended  Disposition:   FU for calcium scoring CT   Signed, Larae Grooms, MD  07/03/2021 11:24 AM    [facility] [place], [place], [place]  [address] Phone: [phone]; Fax: [phone]

## 2021-07-11 ENCOUNTER — Telehealth: Payer: Self-pay | Admitting: *Deleted

## 2021-07-11 DIAGNOSIS — R9431 Abnormal electrocardiogram [ECG] [EKG]: Secondary | ICD-10-CM

## 2021-07-11 DIAGNOSIS — E782 Mixed hyperlipidemia: Secondary | ICD-10-CM

## 2021-07-11 DIAGNOSIS — Z8249 Family history of ischemic heart disease and other diseases of the circulatory system: Secondary | ICD-10-CM

## 2021-07-11 NOTE — Telephone Encounter (Signed)
-----   Message from Livingston Diones, [place] sent at 07/11/2021  1:14 PM EDT ----- Regarding: Missing Diagnosis Code Can someone please add a diagnosis code onto the scheduled calcium score order?

## 2021-07-11 NOTE — Telephone Encounter (Signed)
New order for Calcium score placed and associated diagnosis entered and provided, as ordered by Dr. Irish Lack.  Will make CT dept aware of completed diagnosis and order so they can follow-up with the pt to schedule.

## 2021-07-18 ENCOUNTER — Other Ambulatory Visit: Payer: Self-pay | Admitting: Family Medicine

## 2021-07-28 ENCOUNTER — Inpatient Hospital Stay: Admission: RE | Admit: 2021-07-28 | Payer: 59 | Source: Ambulatory Visit

## 2021-07-28 ENCOUNTER — Ambulatory Visit (INDEPENDENT_AMBULATORY_CARE_PROVIDER_SITE_OTHER)
Admission: RE | Admit: 2021-07-28 | Discharge: 2021-07-28 | Disposition: A | Discharge status: Home / Self Care | Payer: Self-pay | Source: Ambulatory Visit | Attending: Interventional Cardiology | Admitting: Interventional Cardiology

## 2021-07-28 ENCOUNTER — Other Ambulatory Visit: Payer: Self-pay

## 2021-07-28 DIAGNOSIS — E782 Mixed hyperlipidemia: Secondary | ICD-10-CM

## 2021-07-28 DIAGNOSIS — R9431 Abnormal electrocardiogram [ECG] [EKG]: Secondary | ICD-10-CM

## 2021-07-28 DIAGNOSIS — Z8249 Family history of ischemic heart disease and other diseases of the circulatory system: Secondary | ICD-10-CM

## 2021-08-01 ENCOUNTER — Telehealth: Payer: Self-pay | Admitting: *Deleted

## 2021-08-01 DIAGNOSIS — E782 Mixed hyperlipidemia: Secondary | ICD-10-CM

## 2021-08-01 MED ORDER — ROSUVASTATIN CALCIUM 10 MG PO TABS: 10.0000 mg | ORAL_TABLET | Freq: Every day | ORAL | 3 refills | Status: DC

## 2021-08-01 NOTE — Telephone Encounter (Signed)
-----   Message from Jettie Booze, MD sent at 07/30/2021  8:36 PM EST ----- Calcium score > 100.  Would increase rosuvastatin to 10 mg daily.  Recheck liver and lipids in 3 months

## 2021-08-01 NOTE — Telephone Encounter (Signed)
Patient notified. Prescription sent to [facility].  Patient will come in for fasting lab work on 11/01/21

## 2021-11-01 ENCOUNTER — Other Ambulatory Visit: Payer: 59 | Admitting: *Deleted

## 2021-11-01 ENCOUNTER — Other Ambulatory Visit: Payer: Self-pay

## 2021-11-01 DIAGNOSIS — E782 Mixed hyperlipidemia: Secondary | ICD-10-CM

## 2021-11-01 LAB — HEPATIC FUNCTION PANEL
ALT: 33 IU/L (ref 0–44)
AST: 33 IU/L (ref 0–40)
Albumin: 4.8 g/dL (ref 3.8–4.9)
Alkaline Phosphatase: 69 IU/L (ref 44–121)
Bilirubin Total: 0.6 mg/dL (ref 0.0–1.2)
Bilirubin, Direct: 0.15 mg/dL (ref 0.00–0.40)
Total Protein: 6.9 g/dL (ref 6.0–8.5)

## 2021-11-01 LAB — LIPID PANEL
Chol/HDL Ratio: 4.4 ratio (ref 0.0–5.0)
Cholesterol, Total: 196 mg/dL (ref 100–199)
HDL: 45 mg/dL (ref >39)
LDL Chol Calc (NIH): 137 mg/dL — ABNORMAL HIGH (ref 0–99)
Triglycerides: 78 mg/dL (ref 0–149)
VLDL Cholesterol Cal: 14 mg/dL (ref 5–40)

## 2021-11-03 ENCOUNTER — Telehealth: Payer: Self-pay

## 2021-11-03 DIAGNOSIS — E782 Mixed hyperlipidemia: Secondary | ICD-10-CM

## 2021-11-03 NOTE — Telephone Encounter (Signed)
-----   Message from Jettie Booze, MD sent at 11/03/2021  2:33 PM EST ----- Can try atorvastatin 20 mg daily. Liver and lipids in 3 months.

## 2021-11-03 NOTE — Telephone Encounter (Signed)
Spoke with the patient on advisement from Dr. Irish Lack. He states that he would like to do some research about atorvastatin before starting. He would like to know when Dr. Irish Lack would like to see him back. Follow up was going to be based on the results from his calcium score. He states that he will make a decision about atorvastatin when we call back with recommendations on when he needs to be seen again. He states to call him back sometime next week as he is working all weekend and does not have time to research atorvastatin right now.

## 2021-11-04 NOTE — Telephone Encounter (Signed)
I typically would see him once a year.    Statins can cause muscle pain, not usually joint pain, so I am not sure that knee pain is related tot he rosuvastatin.

## 2021-11-06 NOTE — Telephone Encounter (Signed)
Called and spoke with patient regarding follow-up with Dr. Irish Lack and decision about taking atorvastatin.  Patient to follow-up with Dr. Irish Lack in November 2023.   Patient states he is concerned about taking statin medications because when he took Rosuvastatin he experienced significant leg weakness and pain. He states this effected his ability to perform his job and this is why he stopped taking the rosuvastatin. He states the leg weakness/pain resolved when he stopped the statin.   Due to his concern about taking another statin drug, patient requests to continue with dietary changes and increased exercise to improve lipids and to have follow-up labs to see if these measures are effective enough.  Will send to Dr. Irish Lack for review and to advise on if/when he would like to follow-up on lipids.  Patient verbalized understanding.

## 2021-11-07 NOTE — Telephone Encounter (Signed)
OK to check lipids in 3 months

## 2021-11-07 NOTE — Telephone Encounter (Signed)
Left message to call office

## 2021-11-14 NOTE — Telephone Encounter (Signed)
Patient notified.  He will stop by the office for fasting lipid profile on February 14, 2022 ?

## 2021-11-14 NOTE — Addendum Note (Signed)
Addended by: Thompson Grayer on: 11/14/2021 09:36 AM ? ? Modules accepted: Orders ? ?

## 2022-02-14 ENCOUNTER — Other Ambulatory Visit: Payer: 59

## 2022-03-02 ENCOUNTER — Other Ambulatory Visit: Payer: 59

## 2022-03-02 DIAGNOSIS — E782 Mixed hyperlipidemia: Secondary | ICD-10-CM

## 2022-03-02 LAB — LIPID PANEL
Chol/HDL Ratio: 3.6 ratio (ref 0.0–5.0)
Cholesterol, Total: 153 mg/dL (ref 100–199)
HDL: 42 mg/dL (ref >39)
LDL Chol Calc (NIH): 102 mg/dL — ABNORMAL HIGH (ref 0–99)
Triglycerides: 41 mg/dL (ref 0–149)
VLDL Cholesterol Cal: 9 mg/dL (ref 5–40)

## 2022-03-16 ENCOUNTER — Telehealth: Payer: Self-pay | Admitting: Interventional Cardiology

## 2022-03-16 NOTE — Telephone Encounter (Signed)
Pt returning a call about lab results

## 2022-03-16 NOTE — Telephone Encounter (Signed)
Spoke with pt and advised of lab results and recommendation as below per Dr Irish Lack.  Pt states he has not taken Rosuvastatin in 6-8 months due to muscle and joint aches.  Pt states he has made changes with diet and exercise.  He is not open to restarting Rosuvastatin. Pt advised will forward to Dr Irish Lack to review and make further recommendations.  Pt verbalizes understanding and thanked Therapist, sports for the call.    Jettie Booze, MD   03/13/2022  8:25 AM EDT     LDL improved but still above target.  Would increase Crestor to 20 mg daily for additional risk reduction.  Lipids and liver panel in 3 months.

## 2022-03-19 NOTE — Telephone Encounter (Signed)
Left message to call office

## 2022-03-19 NOTE — Telephone Encounter (Signed)
Would offer the option of starting Zetia 10 mg daily.  This is not a statin and decreases absorption of cholesterol in the GI tract.  This can also help lower his cholesterol to target to lower his future risk.  Marland Kitchenext

## 2022-03-30 NOTE — Telephone Encounter (Signed)
I spoke with patient and gave him information from Dr Irish Lack.  He will research Zetia and let us know if he would like to start it.  Patient aware if he starts Zetia follow up lab work would be checked 3 months after starting it.

## 2022-07-29 NOTE — Progress Notes (Unsigned)
Cardiology Office Note   Date:  07/30/2022   ID:  Warren Hatfield, DOB 04-04-1966, MRN [id number]  PCP:  Abner Greenspan, MD    No chief complaint on file.  Abnormal ECG  Wt Readings from Last 3 Encounters:  07/30/22 176 lb 12.8 oz (80.2 kg)  07/03/21 181 lb 14.4 oz (82.5 kg)  12/13/20 175 lb 6 oz (79.5 kg)       History of Present Illness: Warren Hatfield is a 56 y.o. male  who had abnormal ECG, in 2022: " normal sinus rhythm, no ST segment changes, isolated Q-wave in lead III, T wave inversions in lead III.  No clear sign of old MI."  Calcium scoring CT: "Coronary arteries: Normal origins.   Coronary Calcium Score:   Left main: 0   Left anterior descending artery: 86.9   Left circumflex artery: 0   Right coronary artery: 17.8   Total: 105   Percentile: 84th   Pericardium: Normal.   Ascending Aorta: Normal caliber.   Non-cardiac: See separate report from [facility].   IMPRESSION: Coronary calcium score of 105. This was 84th percentile for age-, race-, and sex-matched controls."  Retired Engineer, structural, Media planner.  Still teaches tactical exercises.   Denies : Chest pain. Dizziness. Leg edema. Nitroglycerin use. Orthopnea. Palpitations. Paroxysmal nocturnal dyspnea. Shortness of breath. Syncope.     Past Medical History:  Diagnosis Date   Abnormal EKG    B12 deficiency    Eczema    ED (erectile dysfunction)    Elevated TSH    Hip pain    Hyperlipidemia    Stress reaction    Subclinical hypothyroidism     Past Surgical History:  Procedure Laterality Date   HAND SURGERY  1998   plate nd screws in 3rd finger of right hand following fx   [place]  02/11   VASECTOMY  02/08     Current Outpatient Medications  Medication Sig Dispense Refill   clobetasol cream (TEMOVATE) 7.82 % Apply 1 application topically 2 (two) times daily. 30 g 1   rosuvastatin (CRESTOR) 10 MG tablet Take 1  tablet (10 mg total) by mouth daily. 90 tablet 3   No current facility-administered medications for this visit.    Allergies:   Oxycodone-acetaminophen and Sildenafil    Social History:  The patient  reports that he has quit smoking. His smoking use included cigars. He has never used smokeless tobacco. He reports current alcohol use. He reports that he does not use drugs.   Family History:  The patient's family history includes Breast cancer in his mother; Diabetes in his maternal grandfather and paternal grandmother; Heart disease in his father; Hypertension in his father; Hypothyroidism in his mother.    ROS:  Please see the history of present illness.   Otherwise, review of systems are positive for knee pain.   All other systems are reviewed and negative.    PHYSICAL EXAM: VS:  BP 130/70   Pulse 67   Ht 5' 8.5" (1.74 m)   Wt 176 lb 12.8 oz (80.2 kg)   SpO2 98%   BMI 26.49 kg/m  , BMI Body mass index is 26.49 kg/m. GEN: Well nourished, well developed, in no acute distress HEENT: normal Neck: no JVD, carotid bruits, or masses Cardiac: RRR; no murmurs, rubs, or gallops,no edema  Respiratory:  clear to auscultation bilaterally, normal work of breathing GI: soft, nontender,  nondistended, + BS MS: no deformity or atrophy Skin: warm and dry, no rash Neuro:  Strength and sensation are intact Psych: euthymic mood, full affect   EKG:   The ekg ordered today demonstrates NSR, no ST segment changes   Recent Labs: 11/01/2021: ALT 33   Lipid Panel    Component Value Date/Time   CHOL 153 03/02/2022 1028   TRIG 41 03/02/2022 1028   HDL 42 03/02/2022 1028   CHOLHDL 3.6 03/02/2022 1028   CHOLHDL 3 07/25/2020 0845   VLDL 8.0 07/25/2020 0845   LDLCALC 102 (H) 03/02/2022 1028     Other studies Reviewed: Additional studies/ records that were reviewed today with results demonstrating: In June 2023 total cholesterol 153 HDL 42 LDL 102 triglycerides 41- while off of  Crestor.   ASSESSMENT AND PLAN:  FAmily h/o CAD: No change since prior visit.  Exercise target noted below.  Okay to incorporate some lighter weights to maintain muscle mass.  LDL from 137 to 102 with dietary changes.  No longer on statin.  Coronary artery calcification: Aggressive preventive therapy.   Hyperlipidemia: Whole food, plant-based diet.  High-fiber diet.  Avoid processed foods.  Had a severe knee pain and decreased flexibility while on Crestor. Knee pain Improved after stopping the medicine.  Repeat lipids with PMD. Tobacco abuse: He needs to completely abstain from tobacco products.  Still the occasional cigar, rare. I suspect his knee pain is more from arthritis rather than statin.  He had a hip replacement in the past.  If there is a significant rise in the LDL,We can consider a different statin to see if it is more tolerable than Crestor.   Current medicines are reviewed at length with the patient today.  The patient concerns regarding his medicines were addressed.  The following changes have been made:  No change  Labs/ tests ordered today include:  No orders of the defined types were placed in this encounter.   Recommend 150 minutes/week of aerobic exercise Low fat, low carb, high fiber diet recommended  Disposition:   FU in 1 year   Signed, Larae Grooms, MD  07/30/2022 9:03 AM    [facility] [place], [place], [place]  [address] Phone: [phone]; Fax: [phone]

## 2022-07-30 ENCOUNTER — Encounter: Payer: Self-pay | Admitting: Interventional Cardiology

## 2022-07-30 ENCOUNTER — Ambulatory Visit: Payer: 59 | Attending: Interventional Cardiology | Admitting: Interventional Cardiology

## 2022-07-30 VITALS — BP 130/70 | HR 67 | Ht 68.5 in | Wt 176.8 lb

## 2022-07-30 DIAGNOSIS — R9431 Abnormal electrocardiogram [ECG] [EKG]: Secondary | ICD-10-CM | POA: Diagnosis not present

## 2022-07-30 DIAGNOSIS — I2584 Coronary atherosclerosis due to calcified coronary lesion: Secondary | ICD-10-CM

## 2022-07-30 DIAGNOSIS — E782 Mixed hyperlipidemia: Secondary | ICD-10-CM

## 2022-07-30 DIAGNOSIS — I2510 Atherosclerotic heart disease of native coronary artery without angina pectoris: Secondary | ICD-10-CM | POA: Diagnosis not present

## 2022-07-30 DIAGNOSIS — Z8249 Family history of ischemic heart disease and other diseases of the circulatory system: Secondary | ICD-10-CM | POA: Diagnosis not present

## 2022-07-30 NOTE — Patient Instructions (Signed)
Medication Instructions:  Your physician recommends that you continue on your current medications as directed. Please refer to the Current Medication list given to you today.  *If you need a refill on your cardiac medications before your next appointment, please call your pharmacy*   Lab Work: none If you have labs (blood work) drawn today and your tests are completely normal, you will receive your results only by: [place] (if you have MyChart) OR A paper copy in the mail If you have any lab test that is abnormal or we need to change your treatment, we will call you to review the results.   Testing/Procedures: none   Follow-Up: At [facility], you and your health needs are our priority.  As part of our continuing mission to provide you with exceptional heart care, we have created designated Provider Care Teams.  These Care Teams include your primary Cardiologist (physician) and Advanced Practice Providers (APPs -  Physician Assistants and Nurse Practitioners) who all work together to provide you with the care you need, when you need it.  We recommend signing up for the patient portal called "MyChart".  Sign up information is provided on this After Visit Summary.  MyChart is used to connect with patients for Virtual Visits (Telemedicine).  Patients are able to view lab/test results, encounter notes, upcoming appointments, etc.  Non-urgent messages can be sent to your provider as well.   To learn more about what you can do with MyChart, go to [email or web address].    Your next appointment:   12 month(s)  The format for your next appointment:   In Person  Provider:   Dr Irish Lack    Other [place]

## 2022-09-10 HISTORY — PX: EYE SURGERY: SHX253

## 2023-02-26 ENCOUNTER — Telehealth: Payer: Self-pay | Admitting: Family Medicine

## 2023-02-26 DIAGNOSIS — E7800 Pure hypercholesterolemia, unspecified: Secondary | ICD-10-CM

## 2023-02-26 DIAGNOSIS — E538 Deficiency of other specified B group vitamins: Secondary | ICD-10-CM

## 2023-02-26 DIAGNOSIS — E038 Other specified hypothyroidism: Secondary | ICD-10-CM

## 2023-02-26 DIAGNOSIS — Z0000 Encounter for general adult medical examination without abnormal findings: Secondary | ICD-10-CM

## 2023-02-26 DIAGNOSIS — Z125 Encounter for screening for malignant neoplasm of prostate: Secondary | ICD-10-CM

## 2023-02-26 NOTE — Telephone Encounter (Signed)
-----   Message from Alvina Chou sent at 02/21/2023  7:29 AM EDT ----- Regarding: lab orders for Wednesday, 6.19.24 Patient is scheduled for CPX labs, please order future labs, Thanks , Camelia Eng

## 2023-02-27 ENCOUNTER — Other Ambulatory Visit (INDEPENDENT_AMBULATORY_CARE_PROVIDER_SITE_OTHER): Payer: 59

## 2023-02-27 DIAGNOSIS — E038 Other specified hypothyroidism: Secondary | ICD-10-CM

## 2023-02-27 DIAGNOSIS — E538 Deficiency of other specified B group vitamins: Secondary | ICD-10-CM | POA: Diagnosis not present

## 2023-02-27 DIAGNOSIS — Z125 Encounter for screening for malignant neoplasm of prostate: Secondary | ICD-10-CM

## 2023-02-27 DIAGNOSIS — E7800 Pure hypercholesterolemia, unspecified: Secondary | ICD-10-CM | POA: Diagnosis not present

## 2023-02-27 DIAGNOSIS — Z0000 Encounter for general adult medical examination without abnormal findings: Secondary | ICD-10-CM

## 2023-02-27 LAB — LIPID PANEL
Cholesterol: 192 mg/dL (ref 0–200)
HDL: 42.1 mg/dL (ref >39.00)
LDL Cholesterol: 136 mg/dL — ABNORMAL HIGH (ref 0–99)
NonHDL: 149.54
Total CHOL/HDL Ratio: 5
Triglycerides: 66 mg/dL (ref 0.0–149.0)
VLDL: 13.2 mg/dL (ref 0.0–40.0)

## 2023-02-27 LAB — CBC WITH DIFFERENTIAL/PLATELET
Basophils Absolute: 0 10*3/uL (ref 0.0–0.1)
Basophils Relative: 0.8 % (ref 0.0–3.0)
Eosinophils Absolute: 0.2 10*3/uL (ref 0.0–0.7)
Eosinophils Relative: 3.9 % (ref 0.0–5.0)
HCT: 42.1 % (ref 39.0–52.0)
Hemoglobin: 14 g/dL (ref 13.0–17.0)
Lymphocytes Relative: 31.5 % (ref 12.0–46.0)
Lymphs Abs: 1.6 10*3/uL (ref 0.7–4.0)
MCHC: 33.2 g/dL (ref 30.0–36.0)
MCV: 91.1 fl (ref 78.0–100.0)
Monocytes Absolute: 0.4 10*3/uL (ref 0.1–1.0)
Monocytes Relative: 7.9 % (ref 3.0–12.0)
Neutro Abs: 2.9 10*3/uL (ref 1.4–7.7)
Neutrophils Relative %: 55.9 % (ref 43.0–77.0)
Platelets: 159 10*3/uL (ref 150.0–400.0)
RBC: 4.62 Mil/uL (ref 4.22–5.81)
RDW: 13.2 % (ref 11.5–15.5)
WBC: 5.1 10*3/uL (ref 4.0–10.5)

## 2023-02-27 LAB — COMPREHENSIVE METABOLIC PANEL
ALT: 27 U/L (ref 0–53)
AST: 27 U/L (ref 0–37)
Albumin: 4.1 g/dL (ref 3.5–5.2)
Alkaline Phosphatase: 52 U/L (ref 39–117)
BUN: 15 mg/dL (ref 6–23)
CO2: 30 mEq/L (ref 19–32)
Calcium: 8.8 mg/dL (ref 8.4–10.5)
Chloride: 103 mEq/L (ref 96–112)
Creatinine, Ser: 1.08 mg/dL (ref 0.40–1.50)
GFR: 77.41 mL/min (ref >60.00)
Glucose, Bld: 95 mg/dL (ref 70–99)
Potassium: 4.5 mEq/L (ref 3.5–5.1)
Sodium: 140 mEq/L (ref 135–145)
Total Bilirubin: 0.5 mg/dL (ref 0.2–1.2)
Total Protein: 6.4 g/dL (ref 6.0–8.3)

## 2023-02-27 LAB — T4, FREE: Free T4: 0.82 ng/dL (ref 0.60–1.60)

## 2023-02-27 LAB — PSA: PSA: 0.52 ng/mL (ref 0.10–4.00)

## 2023-02-27 LAB — TSH: TSH: 6.81 u[IU]/mL — ABNORMAL HIGH (ref 0.35–5.50)

## 2023-02-27 LAB — VITAMIN B12: Vitamin B-12: 206 pg/mL — ABNORMAL LOW (ref 211–911)

## 2023-03-06 ENCOUNTER — Encounter: Payer: Self-pay | Admitting: Family Medicine

## 2023-03-06 ENCOUNTER — Ambulatory Visit (INDEPENDENT_AMBULATORY_CARE_PROVIDER_SITE_OTHER): Payer: 59 | Admitting: Family Medicine

## 2023-03-06 VITALS — BP 132/68 | HR 72 | Temp 98.0°F | Ht 66.75 in | Wt 178.2 lb

## 2023-03-06 DIAGNOSIS — E538 Deficiency of other specified B group vitamins: Secondary | ICD-10-CM | POA: Diagnosis not present

## 2023-03-06 DIAGNOSIS — E038 Other specified hypothyroidism: Secondary | ICD-10-CM | POA: Diagnosis not present

## 2023-03-06 DIAGNOSIS — Z0000 Encounter for general adult medical examination without abnormal findings: Secondary | ICD-10-CM

## 2023-03-06 DIAGNOSIS — Z125 Encounter for screening for malignant neoplasm of prostate: Secondary | ICD-10-CM

## 2023-03-06 DIAGNOSIS — E7800 Pure hypercholesterolemia, unspecified: Secondary | ICD-10-CM | POA: Diagnosis not present

## 2023-03-06 DIAGNOSIS — Z8249 Family history of ischemic heart disease and other diseases of the circulatory system: Secondary | ICD-10-CM

## 2023-03-06 MED ORDER — CYANOCOBALAMIN 1000 MCG/ML IJ SOLN
1000.0000 ug | Freq: Once | INTRAMUSCULAR | Status: AC
Start: 2023-03-06 — End: 2023-03-06
  Administered 2023-03-06: 1000 ug via INTRAMUSCULAR

## 2023-03-06 MED ORDER — EZETIMIBE 10 MG PO TABS: 10.0000 mg | ORAL_TABLET | Freq: Every day | ORAL | 3 refills | Status: DC

## 2023-03-06 NOTE — Patient Instructions (Addendum)
If you are interested in the shingles vaccine series (Shingrix), call your insurance or pharmacy to check on coverage and location it must be given.  If affordable - you can schedule it here or at your pharmacy depending on coverage   On days when you are less active (when you don't exercise for work)  Use the bike Add some strength training to your routine, this is important for bone and brain health and can reduce your risk of falls and help your body use insulin properly and regulate weight  Light weights, exercise bands , and internet videos are a good way to start  Yoga (chair or regular), machines , floor exercises or a gym with machines are also good options     Let's do a B12 shot today  Get vitamin B12 over the counter and take 1000 mcg every day  We will check another level in 3 months   Start genetic zetia 10 mg daily  For cholesterol If any side effects let us know   Let's plan labs in 3 months   For cholesterol Avoid red meat/ fried foods/ egg yolks/ fatty breakfast meats/ butter, cheese and high fat dairy/ and shellfish

## 2023-03-06 NOTE — Assessment & Plan Note (Signed)
Lab Results  Component Value Date   VITAMINB12 206 (L) 02/27/2023   Not taking any B12 currently  Shot given today  Instructed to start 1000 mcg B12 daily over the counter  Re check lab in 3 months  Instructed to update if he develops any neuro symptoms

## 2023-03-06 NOTE — Assessment & Plan Note (Signed)
Saw cardiology  LDL back up to 136 Intol to crestor  Normal blood pressure and healthy habits  Will try zetial for cholesterol

## 2023-03-06 NOTE — Assessment & Plan Note (Signed)
Disc goals for lipids and reasons to control them Rev last labs with pt Rev low sat fat diet in detail Family history of CAD early  Saw cardiology Tried rosuvastatin- caused severe knee pain  Open to zetia  Good diet  Will try this and check lab 3 months  Instructed to call if any side effects or concerns

## 2023-03-06 NOTE — Assessment & Plan Note (Signed)
Lab Results  Component Value Date   PSA 0.52 02/27/2023   PSA 0.28 07/25/2020   PSA 0.38 06/29/2019    No family history  No voiding changes  (nocturia times one baseline)

## 2023-03-06 NOTE — Assessment & Plan Note (Signed)
Reviewed health habits including diet and exercise and skin cancer prevention Reviewed appropriate screening tests for age  Also reviewed health mt list, fam hx and immunization status , as well as social and family history   See HPI Labs reviewed and ordered Plans to check on coverage of shingrix vaccine  Colonoscopy 08/2019 wither 10 y recall  Psa is normal / stable  No falls or fractures, exercises regularly  Utd derm visits and uses sun protection  Phq of 0

## 2023-03-06 NOTE — Progress Notes (Signed)
Subjective:    Patient ID: Warren Hatfield, male    DOB: 1966-05-13, 57 y.o.   MRN: [id number]  HPI  Here for health maintenance exam and to review chronic medical problems   Wt Readings from Last 3 Encounters:  03/06/23 178 lb 4 oz (80.9 kg)  07/30/22 176 lb 12.8 oz (80.2 kg)  07/03/21 181 lb 14.4 oz (82.5 kg)   28.13 kg/m  Vitals:   03/06/23 1500  BP: 132/68  Pulse: 72  Temp: 98 F (36.7 C)  SpO2: 96%   Feeling fine  Retired and happy with it  Just some teaching now/ enjoys it / not a lot of hours   Taking care of himself  This freed up time to do projects    Immunization History  Administered Date(s) Administered   Influenza Split 07/22/2012   Influenza,inj,Quad PF,6+ Mos 09/13/2017   Influenza-Unspecified 06/25/2015, 06/15/2019   PFIZER(Purple Top)SARS-COV-2 Vaccination 10/28/2019, 11/19/2019, 09/01/2020   Td 10/11/2006   Tdap 03/25/2017    Health Maintenance Due  Topic Date Due   HIV Screening  Never done   Zoster Vaccines- Shingrix (1 of 2) Never done   Shingrix vaccine -wants to get if covered   Colonoscopy 08/2019 with 10 y recall  No new family cancer history   Prostate health Lab Results  Component Value Date   PSA 0.52 02/27/2023   PSA 0.28 07/25/2020   PSA 0.38 06/29/2019   No urinary changes Nocturia once - this is normal for him   No prostate cancer in the family    Bone health  Falls- none  Fractures-none  Supplements - none  Exercise _ some  [place] bike and some weights  Very active   Utd derm visits Some biopsies  Had a pre cancer on left arm  Wears sun protecton   Mood    03/06/2023    3:04 PM 12/13/2020    8:53 AM 08/04/2019    3:04 PM 04/30/2018    3:30 PM 03/25/2017    3:13 PM  Depression screen PHQ 2/9  Decreased Interest 0 0 0 0 0  Down, Depressed, Hopeless 0 0 0 0 0  PHQ - 2 Score 0 0 0 0 0  Altered sleeping 0 1 1 0 1  Tired, decreased energy 0 1 0 0 1  Change in appetite 0 0 0 0 0  Feeling bad or  failure about yourself  0 0 0 0 0  Trouble concentrating 0 0 0 0 0  Moving slowly or fidgety/restless 0 0 0 0 0  Suicidal thoughts 0 0 0 0 0  PHQ-9 Score 0 2 1 0 2  Difficult doing work/chores Not difficult at all  Not difficult at all     B12 def Lab Results  Component Value Date   VITAMINB12 206 (L) 02/27/2023   Is on and off of this over the counter     Past sub clinical hypothyroidism Lab Results  Component Value Date   TSH 6.81 (H) 02/27/2023   Free T4 of 0.82 which is in the normal range No clinical changes    Hyperlipidemia Lab Results  Component Value Date   CHOL 192 02/27/2023   CHOL 153 03/02/2022   CHOL 196 11/01/2021   Lab Results  Component Value Date   HDL 42.10 02/27/2023   HDL 42 03/02/2022   HDL 45 11/01/2021   Lab Results  Component Value Date   LDLCALC 136 (H) 02/27/2023   LDLCALC 102 (H) 03/02/2022  LDLCALC 137 (H) 11/01/2021   Lab Results  Component Value Date   TRIG 66.0 02/27/2023   TRIG 41 03/02/2022   TRIG 78 11/01/2021   Lab Results  Component Value Date   CHOLHDL 5 02/27/2023   CHOLHDL 3.6 03/02/2022   CHOLHDL 4.4 11/01/2021   No results found for: "LDLDIRECT" Has fam history of early CAD Father died at 2 with sudden cardiac death   He saw cardiology in Aug 17, 2023 and discussed coronary artery calcification  Urged him to try another statin   Tried rosuvastatin - caused a lot of knee pain (really bad)   Cut back on red meat Watches fatty foods    Willing to try zetia   Other labs Lab Results  Component Value Date   NA 140 02/27/2023   K 4.5 02/27/2023   CO2 30 02/27/2023   GLUCOSE 95 02/27/2023   BUN 15 02/27/2023   CREATININE 1.08 02/27/2023   CALCIUM 8.8 02/27/2023   GFR 77.41 02/27/2023   GFRNONAA >60 11/18/2009   Lab Results  Component Value Date   ALT 27 02/27/2023   AST 27 02/27/2023   ALKPHOS 52 02/27/2023   BILITOT 0.5 02/27/2023   Lab Results  Component Value Date   WBC 5.1 02/27/2023    HGB 14.0 02/27/2023   HCT 42.1 02/27/2023   MCV 91.1 02/27/2023   PLT 159.0 02/27/2023          Patient Active Problem List   Diagnosis Date Noted   Family history of early CAD 12/13/2020   Hyperlipidemia 06/13/2020   Abnormal EKG 06/13/2020   Colon cancer screening 04/30/2018   Erectile dysfunction 03/25/2017   Subclinical hypothyroidism 03/25/2017   Prostate cancer screening 03/17/2017   Eczema 11/18/2015   Routine general medical examination at a health care facility 12/22/2010   B12 deficiency 11/16/2009   Past Medical History:  Diagnosis Date   Abnormal EKG    B12 deficiency    Eczema    ED (erectile dysfunction)    Elevated TSH    Hip pain    Hyperlipidemia    Stress reaction    Subclinical hypothyroidism    Past Surgical History:  Procedure Laterality Date   HAND SURGERY  1998   plate nd screws in 3rd finger of right hand following fx   LASIK  1993   Both eyes   TOTAL HIP ARTHROPLASTY  02/11   VASECTOMY  02/08   Social History   Tobacco Use   Smoking status: Former    Types: Cigars   Smokeless tobacco: Never  Vaping Use   Vaping Use: Never used  Substance Use Topics   Alcohol use: Yes    Alcohol/week: 0.0 standard drinks of alcohol    Comment: 1-2 drinks per month maximum   Drug use: No   Family History  Problem Relation Age of Onset   Heart disease Father        MI   Hypertension Father    Hypothyroidism Mother    Breast cancer Mother    Diabetes Maternal Grandfather    Diabetes Paternal Grandmother    Colon cancer Neg Hx    Esophageal cancer Neg Hx    Rectal cancer Neg Hx    Stomach cancer Neg Hx    Allergies  Allergen Reactions   Oxycodone-Acetaminophen Nausea Only   Sildenafil     Pt stated, "It gave me a headache"   Current Outpatient Medications on File Prior to Visit  Medication Sig Dispense Refill  cyanocobalamin (VITAMIN B12) 1000 MCG tablet Take 1,000 mcg by mouth daily.     No current facility-administered  medications on file prior to visit.    Review of Systems  Constitutional:  Negative for activity change, appetite change, fatigue, fever and unexpected weight change.  HENT:  Negative for congestion, rhinorrhea, sore throat and trouble swallowing.   Eyes:  Negative for pain, redness, itching and visual disturbance.  Respiratory:  Negative for cough, chest tightness, shortness of breath and wheezing.   Cardiovascular:  Negative for chest pain and palpitations.  Gastrointestinal:  Negative for abdominal pain, blood in stool, constipation, diarrhea and nausea.  Endocrine: Negative for cold intolerance, heat intolerance, polydipsia and polyuria.  Genitourinary:  Negative for difficulty urinating, dysuria, frequency and urgency.  Musculoskeletal:  Negative for arthralgias, joint swelling and myalgias.  Skin:  Negative for pallor and rash.  Neurological:  Negative for dizziness, tremors, weakness, numbness and headaches.  Hematological:  Negative for adenopathy. Does not bruise/bleed easily.  Psychiatric/Behavioral:  Negative for decreased concentration and dysphoric mood. The patient is not nervous/anxious.        Objective:   Physical Exam Constitutional:      General: He is not in acute distress.    Appearance: Normal appearance. He is well-developed and normal weight. He is not ill-appearing or diaphoretic.  HENT:     Head: Normocephalic and atraumatic.     Right Ear: Tympanic membrane, ear canal and external ear normal.     Left Ear: Tympanic membrane, ear canal and external ear normal.     Nose: Nose normal. No congestion.     Mouth/Throat:     Mouth: Mucous membranes are moist.     Pharynx: Oropharynx is clear. No posterior oropharyngeal erythema.  Eyes:     General: No scleral icterus.       Right eye: No discharge.        Left eye: No discharge.     Conjunctiva/sclera: Conjunctivae normal.     Pupils: Pupils are equal, round, and reactive to light.  Neck:     Thyroid: No  thyromegaly.     Vascular: No carotid bruit or JVD.  Cardiovascular:     Rate and Rhythm: Normal rate and regular rhythm.     Pulses: Normal pulses.     Heart sounds: Normal heart sounds.     No gallop.  Pulmonary:     Effort: Pulmonary effort is normal. No respiratory distress.     Breath sounds: Normal breath sounds. No wheezing or rales.     Comments: Good air exch Chest:     Chest wall: No tenderness.  Abdominal:     General: Bowel sounds are normal. There is no distension or abdominal bruit.     Palpations: Abdomen is soft. There is no mass.     Tenderness: There is no abdominal tenderness.     Hernia: No hernia is present.  Musculoskeletal:        General: No tenderness.     Cervical back: Normal range of motion and neck supple. No rigidity. No muscular tenderness.     Right lower leg: No edema.     Left lower leg: No edema.  Lymphadenopathy:     Cervical: No cervical adenopathy.  Skin:    General: Skin is warm and dry.     Coloration: Skin is not pale.     Findings: No erythema or rash.     Comments: Solar lentigines diffusely Mildly tanned   Neurological:  Mental Status: He is alert.     Cranial Nerves: No cranial nerve deficit.     Motor: No abnormal muscle tone.     Coordination: Coordination normal.     Gait: Gait normal.     Deep Tendon Reflexes: Reflexes are normal and symmetric. Reflexes normal.  Psychiatric:        Mood and Affect: Mood normal.        Cognition and Memory: Cognition normal.           Assessment & Plan:   Problem List Items Addressed This Visit       Endocrine   Subclinical hypothyroidism    Lab Results  Component Value Date   TSH 6.81 (H) 02/27/2023  FT4 is still in the normal range at 0.82 No clinical changes Will continue to monitor / no treatment        Other   Routine general medical examination at a health care facility - Primary    Reviewed health habits including diet and exercise and skin cancer  prevention Reviewed appropriate screening tests for age  Also reviewed health mt list, fam hx and immunization status , as well as social and family history   See HPI Labs reviewed and ordered Plans to check on coverage of shingrix vaccine  Colonoscopy 08/2019 wither 10 y recall  Psa is normal / stable  No falls or fractures, exercises regularly  Utd derm visits and uses sun protection  Phq of 0      Prostate cancer screening    Lab Results  Component Value Date   PSA 0.52 02/27/2023   PSA 0.28 07/25/2020   PSA 0.38 06/29/2019   No family history  No voiding changes  (nocturia times one baseline)      Hyperlipidemia    Disc goals for lipids and reasons to control them Rev last labs with pt Rev low sat fat diet in detail Family history of CAD early  Saw cardiology Tried rosuvastatin- caused severe knee pain  Open to zetia  Good diet  Will try this and check lab 3 months  Instructed to call if any side effects or concerns       Relevant Medications   ezetimibe (ZETIA) 10 MG tablet   Family history of early CAD    Saw cardiology  LDL back up to 136 Intol to crestor  Normal blood pressure and healthy habits  Will try zetial for cholesterol        B12 deficiency    Lab Results  Component Value Date   VITAMINB12 206 (L) 02/27/2023  Not taking any B12 currently  Shot given today  Instructed to start 1000 mcg B12 daily over the counter  Re check lab in 3 months  Instructed to update if he develops any neuro symptoms

## 2023-03-06 NOTE — Assessment & Plan Note (Signed)
Lab Results  Component Value Date   TSH 6.81 (H) 02/27/2023   FT4 is still in the normal range at 0.82 No clinical changes Will continue to monitor / no treatment

## 2023-05-10 ENCOUNTER — Ambulatory Visit: Payer: 59 | Admitting: Family Medicine

## 2023-05-10 ENCOUNTER — Encounter: Payer: Self-pay | Admitting: Family Medicine

## 2023-05-10 VITALS — BP 136/76 | HR 82 | Temp 99.2°F | Ht 66.0 in | Wt 180.4 lb

## 2023-05-10 DIAGNOSIS — Z1152 Encounter for screening for COVID-19: Secondary | ICD-10-CM

## 2023-05-10 DIAGNOSIS — J4520 Mild intermittent asthma, uncomplicated: Secondary | ICD-10-CM | POA: Diagnosis not present

## 2023-05-10 DIAGNOSIS — U071 COVID-19: Secondary | ICD-10-CM | POA: Diagnosis not present

## 2023-05-10 LAB — POC COVID19 BINAXNOW: SARS Coronavirus 2 Ag: POSITIVE — AB

## 2023-05-10 MED ORDER — NIRMATRELVIR/RITONAVIR (PAXLOVID)TABLET
3.0000 | ORAL_TABLET | Freq: Two times a day (BID) | ORAL | 0 refills | Status: AC
Start: 2023-05-10 — End: 2023-05-15

## 2023-05-10 MED ORDER — PREDNISONE 10 MG PO TABS
10.0000 mg | ORAL_TABLET | Freq: Two times a day (BID) | ORAL | 0 refills | Status: AC
Start: 2023-05-10 — End: 2023-05-17

## 2023-05-10 NOTE — Progress Notes (Unsigned)
Established Patient Office Visit   Subjective:  Patient ID: Warren Hatfield, male    DOB: 1966/02/18  Age: 57 y.o. MRN: [id number]  Chief Complaint  Patient presents with   Cough    Cough, fever, body aches x 8 days.     Cough Associated symptoms include myalgias and wheezing. Pertinent negatives include no eye redness, headaches, hemoptysis, rash, sore throat or shortness of breath.   Encounter Diagnoses  Name Primary?   Encounter for screening for COVID-19 Yes   COVID    Mild intermittent reactive airway disease without complication   Presents with an 11-day history of URI signs and symptoms that now predominantly involve a dry cough with relapsing fevers, myalgias, fatigue/malaise and some wheezing.  Denies shortness of breath or difficulty breathing.  There are no symptoms in the upper respiratory tract area.  He was seen at urgent care and placed on a 7-day course of Augmentin which he finished yesterday.  He has no asthma history. {History (Optional):23778}  Review of Systems  Constitutional:  Positive for malaise/fatigue.  HENT: Negative.  Negative for congestion, sinus pain and sore throat.   Eyes:  Negative for blurred vision, discharge and redness.  Respiratory:  Positive for cough and wheezing. Negative for hemoptysis, sputum production and shortness of breath.   Cardiovascular: Negative.   Gastrointestinal:  Negative for abdominal pain.  Genitourinary: Negative.   Musculoskeletal:  Positive for myalgias.  Skin:  Negative for rash.  Neurological:  Negative for tingling, loss of consciousness, weakness and headaches.  Endo/Heme/Allergies:  Negative for polydipsia.     Current Outpatient Medications:    cyanocobalamin (VITAMIN B12) 1000 MCG tablet, Take 1,000 mcg by mouth daily., Disp: , Rfl:    ezetimibe (ZETIA) 10 MG tablet, Take 1 tablet (10 mg total) by mouth daily., Disp: 90 tablet, Rfl: 3   nirmatrelvir/ritonavir (PAXLOVID) 20 x 150 MG & 10 x 100MG  TABS,  Take 3 tablets by mouth 2 (two) times daily for 5 days. (Take nirmatrelvir 150 mg two tablets twice daily for 5 days and ritonavir 100 mg one tablet twice daily for 5 days) Patient GFR is 77, Disp: 30 tablet, Rfl: 0   predniSONE (DELTASONE) 10 MG tablet, Take 1 tablet (10 mg total) by mouth 2 (two) times daily with a meal for 7 days., Disp: 14 tablet, Rfl: 0   Objective:     BP 136/76   Pulse 82   Temp 99.2 F (37.3 C)   Ht 5\' 6"  (1.676 m)   Wt 180 lb 6.4 oz (81.8 kg)   SpO2 97%   BMI 29.12 kg/m  {Vitals History (Optional):23777}  Physical Exam Constitutional:      General: He is not in acute distress.    Appearance: Normal appearance. He is not ill-appearing, toxic-appearing or diaphoretic.  HENT:     Head: Normocephalic and atraumatic.     Right Ear: Tympanic membrane, ear canal and external ear normal.     Left Ear: Tympanic membrane, ear canal and external ear normal.     Mouth/Throat:     Mouth: Mucous membranes are moist.     Pharynx: Oropharynx is clear. No oropharyngeal exudate or posterior oropharyngeal erythema.  Eyes:     General: No scleral icterus.       Right eye: No discharge.        Left eye: No discharge.     Extraocular Movements: Extraocular movements intact.     Conjunctiva/sclera: Conjunctivae normal.     Pupils:  Pupils are equal, round, and reactive to light.  Cardiovascular:     Rate and Rhythm: Normal rate and regular rhythm.  Pulmonary:     Effort: Pulmonary effort is normal. No respiratory distress.     Breath sounds: Wheezing present. No rhonchi or rales.  Abdominal:     General: Bowel sounds are normal.     Tenderness: There is no abdominal tenderness. There is no guarding.  Musculoskeletal:     Cervical back: No rigidity or tenderness.  Skin:    General: Skin is warm and dry.  Neurological:     Mental Status: He is alert and oriented to person, place, and time.  Psychiatric:        Mood and Affect: Mood normal.        Behavior: Behavior  normal.      Results for orders placed or performed in visit on 05/10/23  POC COVID-19 BinaxNow  Result Value Ref Range   SARS Coronavirus 2 Ag Positive (A) Negative    {Labs (Optional):23779}  The 10-year ASCVD risk score (Arnett DK, et al., 2019) is: 6.9%    Assessment & Plan:   Encounter for screening for COVID-19 -     POC COVID-19 BinaxNow  COVID -     nirmatrelvir/ritonavir; Take 3 tablets by mouth 2 (two) times daily for 5 days. (Take nirmatrelvir 150 mg two tablets twice daily for 5 days and ritonavir 100 mg one tablet twice daily for 5 days) Patient GFR is 77  Dispense: 30 tablet; Refill: 0  Mild intermittent reactive airway disease without complication -     predniSONE; Take 1 tablet (10 mg total) by mouth 2 (two) times daily with a meal for 7 days.  Dispense: 14 tablet; Refill: 0    Return Follow-up if not improving in 1 week.Mliss Sax, MD

## 2023-06-04 ENCOUNTER — Telehealth: Payer: Self-pay | Admitting: Family Medicine

## 2023-06-04 DIAGNOSIS — E7800 Pure hypercholesterolemia, unspecified: Secondary | ICD-10-CM

## 2023-06-04 DIAGNOSIS — E538 Deficiency of other specified B group vitamins: Secondary | ICD-10-CM

## 2023-06-04 NOTE — Telephone Encounter (Signed)
-----   Message from Lovena Neighbours sent at 05/24/2023  3:19 PM EDT ----- Regarding: Labs for Thursday 9.26.24 Please put fasting lab orders in future. Thank you, Denny Peon

## 2023-06-06 ENCOUNTER — Other Ambulatory Visit: Payer: 59

## 2023-06-07 ENCOUNTER — Ambulatory Visit: Payer: 59

## 2023-06-07 ENCOUNTER — Other Ambulatory Visit (INDEPENDENT_AMBULATORY_CARE_PROVIDER_SITE_OTHER): Payer: 59

## 2023-06-07 DIAGNOSIS — E7800 Pure hypercholesterolemia, unspecified: Secondary | ICD-10-CM

## 2023-06-07 DIAGNOSIS — E538 Deficiency of other specified B group vitamins: Secondary | ICD-10-CM | POA: Diagnosis not present

## 2023-06-07 DIAGNOSIS — Z23 Encounter for immunization: Secondary | ICD-10-CM | POA: Diagnosis not present

## 2023-06-07 LAB — LIPID PANEL
Cholesterol: 167 mg/dL (ref 0–200)
HDL: 54 mg/dL (ref >39.00)
LDL Cholesterol: 103 mg/dL — ABNORMAL HIGH (ref 0–99)
NonHDL: 113.13
Total CHOL/HDL Ratio: 3
Triglycerides: 53 mg/dL (ref 0.0–149.0)
VLDL: 10.6 mg/dL (ref 0.0–40.0)

## 2023-06-07 LAB — VITAMIN B12: Vitamin B-12: 518 pg/mL (ref 211–911)

## 2024-03-06 ENCOUNTER — Ambulatory Visit: Admitting: Family Medicine

## 2024-03-06 ENCOUNTER — Encounter: Payer: Self-pay | Admitting: Family Medicine

## 2024-03-06 VITALS — BP 118/66 | HR 61 | Temp 97.8°F | Ht 66.0 in | Wt 178.2 lb

## 2024-03-06 DIAGNOSIS — T63451A Toxic effect of venom of hornets, accidental (unintentional), initial encounter: Secondary | ICD-10-CM

## 2024-03-06 DIAGNOSIS — Z20828 Contact with and (suspected) exposure to other viral communicable diseases: Secondary | ICD-10-CM | POA: Diagnosis not present

## 2024-03-06 DIAGNOSIS — T63461A Toxic effect of venom of wasps, accidental (unintentional), initial encounter: Secondary | ICD-10-CM

## 2024-03-06 DIAGNOSIS — T63441A Toxic effect of venom of bees, accidental (unintentional), initial encounter: Secondary | ICD-10-CM | POA: Insufficient documentation

## 2024-03-06 MED ORDER — TRIAMCINOLONE ACETONIDE 0.1 % EX CREA: 1.0000 | TOPICAL_CREAM | Freq: Two times a day (BID) | CUTANEOUS | 0 refills | Status: AC

## 2024-03-06 NOTE — Assessment & Plan Note (Addendum)
 Pt was in area last weekend where reported case of child with measles was  He has no symptoms He is certain he was fully immunized (for school/college)   No action at this time  He does not desire vaccine or titers   Instructed to call/ get care asap if he develops any symptoms/ reviewed these   Call back and Er precautions noted in detail today

## 2024-03-06 NOTE — Progress Notes (Unsigned)
 Subjective:    Patient ID: Warren Hatfield, male    DOB: 08/28/1966, 58 y.o.   MRN: [id number]  HPI  Wt Readings from Last 3 Encounters:  03/06/24 178 lb 4 oz (80.9 kg)  05/10/23 180 lb 6.4 oz (81.8 kg)  03/06/23 178 lb 4 oz (80.9 kg)   28.77 kg/m  Vitals:   03/06/24 0856  BP: 118/66  Pulse: 61  Temp: 97.8 F (36.6 C)  SpO2: 98%     Pt presents with c/o  Exposure to measles potentially  Also wasp stings   Stung when mowing  He moved chairs under a next   Stung multiple times on left neg and ankle  Very itchy    [place] [facility] [place] with [place]   Does not have access to his vaccine info  He looked for it  Hospital doctor to school - public    No symptoms  No ST or fever or cough or rash    Patient Active Problem List   Diagnosis Date Noted   Sting from hornet, wasp, or bee 03/06/2024   History of exposure to measles 03/06/2024   Family history of early CAD 12/13/2020   Hyperlipidemia 06/13/2020   Abnormal EKG 06/13/2020   Colon cancer screening 04/30/2018   Erectile dysfunction 03/25/2017   Subclinical hypothyroidism 03/25/2017   Prostate cancer screening 03/17/2017   Eczema 11/18/2015   Routine general medical examination at a health care facility 12/22/2010   B12 deficiency 11/16/2009   Past Medical History:  Diagnosis Date   Abnormal EKG    B12 deficiency    Eczema    ED (erectile dysfunction)    Elevated TSH    Hip pain    Hyperlipidemia    Stress reaction    Subclinical hypothyroidism    Past Surgical History:  Procedure Laterality Date   HAND SURGERY  1998   plate nd screws in 3rd finger of right hand following fx   LASIK  1993   Both eyes   TOTAL HIP ARTHROPLASTY  02/11   VASECTOMY  02/08   Social History   Tobacco Use   Smoking status: Former    Types: Cigars   Smokeless tobacco: Never  Vaping Use   Vaping status: Never Used  Substance Use Topics   Alcohol use: Yes    Alcohol/week: 0.0 standard  drinks of alcohol    Comment: 1-2 drinks per month maximum   Drug use: No   Family History  Problem Relation Age of Onset   Heart disease Father        MI   Hypertension Father    Hypothyroidism Mother    Breast cancer Mother    Diabetes Maternal Grandfather    Diabetes Paternal Grandmother    Colon cancer Neg Hx    Esophageal cancer Neg Hx    Rectal cancer Neg Hx    Stomach cancer Neg Hx    Allergies  Allergen Reactions   Oxycodone-Acetaminophen Nausea Only   Sildenafil      Pt stated, It gave me a headache   Current Outpatient Medications on File Prior to Visit  Medication Sig Dispense Refill   cyanocobalamin  (VITAMIN B12) 1000 MCG tablet Take 1,000 mcg by mouth daily.     ezetimibe  (ZETIA ) 10 MG tablet Take 1 tablet (10 mg total) by mouth daily. 90 tablet 3   No current facility-administered medications on file prior to visit.    Review of Systems  Constitutional:  Negative for activity change, appetite change, fatigue, fever and unexpected weight change.  HENT:  Negative for congestion, rhinorrhea, sore throat and trouble swallowing.   Eyes:  Negative for pain, redness, itching and visual disturbance.  Respiratory:  Negative for cough, chest tightness, shortness of breath and wheezing.   Cardiovascular:  Negative for chest pain and palpitations.  Gastrointestinal:  Negative for abdominal pain, blood in stool, constipation, diarrhea and nausea.  Endocrine: Negative for cold intolerance, heat intolerance, polydipsia and polyuria.  Genitourinary:  Negative for difficulty urinating, dysuria, frequency and urgency.  Musculoskeletal:  Negative for arthralgias, joint swelling and myalgias.  Skin:  Negative for pallor and rash.       Stings on left leg Itchy  No rash  Neurological:  Negative for dizziness, tremors, weakness, numbness and headaches.  Hematological:  Negative for adenopathy. Does not bruise/bleed easily.  Psychiatric/Behavioral:  Negative for decreased  concentration and dysphoric mood. The patient is not nervous/anxious.        Objective:   Physical Exam Constitutional:      General: He is not in acute distress.    Appearance: Normal appearance. He is well-developed and normal weight. He is not ill-appearing or diaphoretic.  HENT:     Head: Normocephalic and atraumatic.     Nose: Nose normal.     Mouth/Throat:     Mouth: Mucous membranes are moist.     Pharynx: Oropharynx is clear. No oropharyngeal exudate or posterior oropharyngeal erythema.   Eyes:     Conjunctiva/sclera: Conjunctivae normal.     Pupils: Pupils are equal, round, and reactive to light.   Neck:     Thyroid : No thyromegaly.     Vascular: No carotid bruit or JVD.   Cardiovascular:     Rate and Rhythm: Normal rate and regular rhythm.     Heart sounds: Normal heart sounds.     No gallop.  Pulmonary:     Effort: Pulmonary effort is normal. No respiratory distress.     Breath sounds: Normal breath sounds. No wheezing or rales.  Abdominal:     General: There is no distension or abdominal bruit.     Palpations: Abdomen is soft.   Musculoskeletal:     Cervical back: Normal range of motion and neck supple.     Right lower leg: No edema.  Lymphadenopathy:     Cervical: No cervical adenopathy.   Skin:    General: Skin is warm and dry.     Coloration: Skin is not pale.     Findings: No rash.     Comments: 3 wasp stings on left lateral/posterior leg and ankle Erythema with mild swelling  Few excoriations  No open areas   Pt is actively rubbing and scratching   Very slight swelling of ankle     Neurological:     Mental Status: He is alert.     Coordination: Coordination normal.     Deep Tendon Reflexes: Reflexes are normal and symmetric. Reflexes normal.   Psychiatric:        Mood and Affect: Mood normal.           Assessment & Plan:   Problem List Items Addressed This Visit       Other   Sting from hornet, wasp, or bee   At least 3  stings on left lateral leg and ankle with local erythema/ mild swelling and intense itching Reassuring exam  Instructed to keep areas clean with soap and water Try cool compress  Zyrtec 10 mg daily prn  Triamcinolone cramp 0.1% bid prn   Update if not starting to improve in a week or if worsening  Call back and Er precautions noted in detail today        History of exposure to measles - Primary   Pt was in area last weekend where reported case of child with measles was  He has no symptoms He is certain he was fully immunized (for school/college)   No action at this time  He does not desire vaccine or titers   Instructed to call/ get care asap if he develops any symptoms/ reviewed these   Call back and Er precautions noted in detail today

## 2024-03-06 NOTE — Patient Instructions (Addendum)
 Keep the sting areas clean with soap and water  Stay cool (hot conditions make itching worse)  Cold compress is helpful   Zyrtec is helpful 10 mg daily   Try the triamcinolone cream up to twice daily   Update if not starting to improve in a week or if worsening     Watch for any respiratory symptoms or rash  Call asap if this occurs

## 2024-03-06 NOTE — Assessment & Plan Note (Signed)
 At least 3 stings on left lateral leg and ankle with local erythema/ mild swelling and intense itching Reassuring exam  Instructed to keep areas clean with soap and water Try cool compress Zyrtec 10 mg daily prn  Triamcinolone cramp 0.1% bid prn   Update if not starting to improve in a week or if worsening  Call back and Er precautions noted in detail today

## 2024-03-26 ENCOUNTER — Other Ambulatory Visit: Payer: Self-pay | Admitting: Family Medicine

## 2024-03-27 NOTE — Telephone Encounter (Signed)
Pt is overdue for CPE (labs prior), please schedule and then route back to me to refill, thanks

## 2024-04-12 ENCOUNTER — Telehealth: Payer: Self-pay | Admitting: Family Medicine

## 2024-04-12 DIAGNOSIS — Z125 Encounter for screening for malignant neoplasm of prostate: Secondary | ICD-10-CM

## 2024-04-12 DIAGNOSIS — E538 Deficiency of other specified B group vitamins: Secondary | ICD-10-CM

## 2024-04-12 DIAGNOSIS — Z0000 Encounter for general adult medical examination without abnormal findings: Secondary | ICD-10-CM

## 2024-04-12 DIAGNOSIS — E7800 Pure hypercholesterolemia, unspecified: Secondary | ICD-10-CM

## 2024-04-12 DIAGNOSIS — E038 Other specified hypothyroidism: Secondary | ICD-10-CM

## 2024-04-12 NOTE — Telephone Encounter (Signed)
-----   Message from Veva JINNY Ferrari sent at 03/31/2024 10:45 AM EDT ----- Regarding: Lab orders for MON, 8.4.25 Patient is scheduled for CPX labs, please order future labs, Thanks , Veva

## 2024-04-13 ENCOUNTER — Ambulatory Visit: Payer: Self-pay | Admitting: Family Medicine

## 2024-04-13 ENCOUNTER — Other Ambulatory Visit (INDEPENDENT_AMBULATORY_CARE_PROVIDER_SITE_OTHER)

## 2024-04-13 DIAGNOSIS — E038 Other specified hypothyroidism: Secondary | ICD-10-CM | POA: Diagnosis not present

## 2024-04-13 DIAGNOSIS — E7800 Pure hypercholesterolemia, unspecified: Secondary | ICD-10-CM

## 2024-04-13 DIAGNOSIS — Z125 Encounter for screening for malignant neoplasm of prostate: Secondary | ICD-10-CM

## 2024-04-13 DIAGNOSIS — Z0000 Encounter for general adult medical examination without abnormal findings: Secondary | ICD-10-CM

## 2024-04-13 DIAGNOSIS — E538 Deficiency of other specified B group vitamins: Secondary | ICD-10-CM | POA: Diagnosis not present

## 2024-04-13 LAB — PSA: PSA: 0.78 ng/mL (ref 0.10–4.00)

## 2024-04-13 LAB — COMPREHENSIVE METABOLIC PANEL WITH GFR
ALT: 21 U/L (ref 0–53)
AST: 24 U/L (ref 0–37)
Albumin: 4.2 g/dL (ref 3.5–5.2)
Alkaline Phosphatase: 47 U/L (ref 39–117)
BUN: 14 mg/dL (ref 6–23)
CO2: 31 meq/L (ref 19–32)
Calcium: 9.1 mg/dL (ref 8.4–10.5)
Chloride: 105 meq/L (ref 96–112)
Creatinine, Ser: 1.08 mg/dL (ref 0.40–1.50)
GFR: 76.8 mL/min (ref >60.00)
Glucose, Bld: 97 mg/dL (ref 70–99)
Potassium: 4.6 meq/L (ref 3.5–5.1)
Sodium: 141 meq/L (ref 135–145)
Total Bilirubin: 0.6 mg/dL (ref 0.2–1.2)
Total Protein: 6.2 g/dL (ref 6.0–8.3)

## 2024-04-13 LAB — LIPID PANEL
Cholesterol: 145 mg/dL (ref 0–200)
HDL: 41.2 mg/dL (ref >39.00)
LDL Cholesterol: 92 mg/dL (ref 0–99)
NonHDL: 103.69
Total CHOL/HDL Ratio: 4
Triglycerides: 56 mg/dL (ref 0.0–149.0)
VLDL: 11.2 mg/dL (ref 0.0–40.0)

## 2024-04-13 LAB — CBC WITH DIFFERENTIAL/PLATELET
Basophils Absolute: 0 K/uL (ref 0.0–0.1)
Basophils Relative: 0.6 % (ref 0.0–3.0)
Eosinophils Absolute: 0.2 K/uL (ref 0.0–0.7)
Eosinophils Relative: 4.4 % (ref 0.0–5.0)
HCT: 39.8 % (ref 39.0–52.0)
Hemoglobin: 13.4 g/dL (ref 13.0–17.0)
Lymphocytes Relative: 36.6 % (ref 12.0–46.0)
Lymphs Abs: 1.7 K/uL (ref 0.7–4.0)
MCHC: 33.6 g/dL (ref 30.0–36.0)
MCV: 90.6 fl (ref 78.0–100.0)
Monocytes Absolute: 0.4 K/uL (ref 0.1–1.0)
Monocytes Relative: 8.6 % (ref 3.0–12.0)
Neutro Abs: 2.3 K/uL (ref 1.4–7.7)
Neutrophils Relative %: 49.8 % (ref 43.0–77.0)
Platelets: 149 K/uL — ABNORMAL LOW (ref 150.0–400.0)
RBC: 4.4 Mil/uL (ref 4.22–5.81)
RDW: 12.8 % (ref 11.5–15.5)
WBC: 4.6 K/uL (ref 4.0–10.5)

## 2024-04-13 LAB — VITAMIN B12: Vitamin B-12: 1498 pg/mL — ABNORMAL HIGH (ref 211–911)

## 2024-04-13 LAB — TSH: TSH: 4.73 u[IU]/mL (ref 0.35–5.50)

## 2024-04-13 LAB — T4, FREE: Free T4: 0.85 ng/dL (ref 0.60–1.60)

## 2024-04-17 ENCOUNTER — Ambulatory Visit (INDEPENDENT_AMBULATORY_CARE_PROVIDER_SITE_OTHER): Admitting: Family Medicine

## 2024-04-17 ENCOUNTER — Encounter: Payer: Self-pay | Admitting: Family Medicine

## 2024-04-17 VITALS — BP 118/78 | HR 71 | Temp 97.9°F | Ht 66.5 in | Wt 172.5 lb

## 2024-04-17 DIAGNOSIS — Z125 Encounter for screening for malignant neoplasm of prostate: Secondary | ICD-10-CM | POA: Diagnosis not present

## 2024-04-17 DIAGNOSIS — Z1211 Encounter for screening for malignant neoplasm of colon: Secondary | ICD-10-CM

## 2024-04-17 DIAGNOSIS — E538 Deficiency of other specified B group vitamins: Secondary | ICD-10-CM

## 2024-04-17 DIAGNOSIS — E7800 Pure hypercholesterolemia, unspecified: Secondary | ICD-10-CM | POA: Diagnosis not present

## 2024-04-17 DIAGNOSIS — E038 Other specified hypothyroidism: Secondary | ICD-10-CM

## 2024-04-17 DIAGNOSIS — Z8669 Personal history of other diseases of the nervous system and sense organs: Secondary | ICD-10-CM

## 2024-04-17 DIAGNOSIS — Z0000 Encounter for general adult medical examination without abnormal findings: Secondary | ICD-10-CM

## 2024-04-17 NOTE — Progress Notes (Signed)
 Encouraged   Subjective:    Patient ID: Warren Hatfield, male    DOB: 19-Oct-1965, 58 y.o.   MRN: [id number]  HPI  Here for health maintenance exam and to review chronic medical problems   Wt Readings from Last 3 Encounters:  04/17/24 172 lb 8 oz (78.2 kg)  03/06/24 178 lb 4 oz (80.9 kg)  05/10/23 180 lb 6.4 oz (81.8 kg)   27.43 kg/m  Vitals:   04/17/24 1559  BP: 118/78  Pulse: 71  Temp: 97.9 F (36.6 C)  SpO2: 97%    Immunization History  Administered Date(s) Administered   Influenza Split 07/22/2012   Influenza, Seasonal, Injecte, Preservative Fre 06/07/2023   Influenza,inj,Quad PF,6+ Mos 09/13/2017   Influenza-Unspecified 06/25/2015, 06/15/2019   PFIZER(Purple Top)SARS-COV-2 Vaccination 10/28/2019, 11/19/2019, 09/01/2020   Td 10/11/2006   Tdap 03/25/2017    Health Maintenance Due  Topic Date Due   HIV Screening  Never done   Pneumococcal Vaccine: 19-49 Years (1 of 2 - PCV) Never done   Pneumococcal Vaccine: 50+ Years (1 of 2 - PCV) Never done   Hepatitis B Vaccines (1 of 3 - 19+ 3-dose series) Never done   Zoster Vaccines- Shingrix (1 of 2) Never done   COVID-19 Vaccine (4 - 2024-25 season) 05/12/2023   Had cataract surgery last month in left eye  Had a torn retina in sept as well - caused the cataract and scarring on retina  Had superficial keratotomy  Goes back in 2 weeks  About 20/80 in that eye   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    Gets flu shot every fall   Shingrix vaccine -interested / will check on coverage    Prostate health Lab Results  Component Value Date   PSA 0.78 04/13/2024   PSA 0.52 02/27/2023   PSA 0.28 07/25/2020   No problems with urination  Nocturia times one  No family history    Colon cancer screening -colonoscopy 08/2019 with 10 y recall   Bone health   Falls-none  Fractures-none  Supplements -vit B12  Mag glycinate (helps cramps)  Glucosamine / tumeric/ msm joint  supplement     Exercise (when not having eye surgery) A lot of construction work/building a basement  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     Mood    04/17/2024    4:03 PM 03/06/2023    3:04 PM 12/13/2020    8:53 AM 08/04/2019    3:04 PM 04/30/2018    3:30 PM  Depression screen PHQ 2/9  Decreased Interest 0 0 0 0 0  Down, Depressed, Hopeless 0 0 0 0 0  PHQ - 2 Score 0 0 0 0 0  Altered sleeping 0 0 1 1 0  Tired, decreased energy 0 0 1 0 0  Change in appetite 0 0 0 0 0  Feeling bad or failure about yourself  0 0 0 0 0  Trouble concentrating 0 0 0 0 0  Moving slowly or fidgety/restless 0 0 0 0 0  Suicidal thoughts 0 0 0 0 0  PHQ-9 Score 0 0 2 1 0  Difficult doing work/chores Not difficult at all Not difficult at all  Not difficult at all     Subclinical hypothyroid in past Normal TSH this check  Lab Results  Component Value Date   TSH 4.73 04/13/2024    Hyperlipidemia Lab Results  Component  Value Date   CHOL 145 04/13/2024   CHOL 167 06/07/2023   CHOL 192 02/27/2023   Lab Results  Component Value Date   HDL 41.20 04/13/2024   HDL 54.00 06/07/2023   HDL 42.10 02/27/2023   Lab Results  Component Value Date   LDLCALC 92 04/13/2024   LDLCALC 103 (H) 06/07/2023   LDLCALC 136 (H) 02/27/2023   Lab Results  Component Value Date   TRIG 56.0 04/13/2024   TRIG 53.0 06/07/2023   TRIG 66.0 02/27/2023   Lab Results  Component Value Date   CHOLHDL 4 04/13/2024   CHOLHDL 3 06/07/2023   CHOLHDL 5 02/27/2023   No results found for: LDLDIRECT  Intol of statin in past (myopathy)  Taking zetia  10 mg daily   Diet is fairly good l No fast food  Beef maybe once per week    Has seen cardiology for history of CAD in family  CCS or 84th percentile    B12 def Lab Results  Component Value Date   VITAMINB12 1,498 (H) 04/13/2024   Had shot in June Taking 2500 mcg daily  Lab Results  Component Value Date   WBC 4.6 04/13/2024    HGB 13.4 04/13/2024   HCT 39.8 04/13/2024   MCV 90.6 04/13/2024   PLT 149.0 (L) 04/13/2024   Lab Results  Component Value Date   ALT 21 04/13/2024   AST 24 04/13/2024   ALKPHOS 47 04/13/2024   BILITOT 0.6 04/13/2024    Lab Results  Component Value Date   NA 141 04/13/2024   K 4.6 04/13/2024   CO2 31 04/13/2024   GLUCOSE 97 04/13/2024   BUN 14 04/13/2024   CREATININE 1.08 04/13/2024   CALCIUM  9.1 04/13/2024   GFR 76.80 04/13/2024   GFRNONAA >60 11/18/2009       Patient Active Problem List   Diagnosis Date Noted   History of retinal tear 04/19/2024   Sting from hornet, wasp, or bee 03/06/2024   History of exposure to measles 03/06/2024   Family history of early CAD 12/13/2020   Hyperlipidemia 06/13/2020   Abnormal EKG 06/13/2020   Colon cancer screening 04/30/2018   Erectile dysfunction 03/25/2017   Subclinical hypothyroidism 03/25/2017   Prostate cancer screening 03/17/2017   Eczema 11/18/2015   Routine general medical examination at a health care facility 12/22/2010   B12 deficiency 11/16/2009   Past Medical History:  Diagnosis Date   Abnormal EKG    B12 deficiency    Eczema    ED (erectile dysfunction)    Elevated TSH    Hip pain    Hyperlipidemia    Stress reaction    Subclinical hypothyroidism    Past Surgical History:  Procedure Laterality Date   HAND SURGERY  1998   plate nd screws in 3rd finger of right hand following fx   LASIK  1993   Both eyes   TOTAL HIP ARTHROPLASTY  02/11   VASECTOMY  02/08   Social History   Tobacco Use   Smoking status: Former    Types: Cigars   Smokeless tobacco: Never  Vaping Use   Vaping status: Never Used  Substance Use Topics   Alcohol use: Yes    Alcohol/week: 0.0 standard drinks of alcohol    Comment: 1-2 drinks per month maximum   Drug use: No   Family History  Problem Relation Age of Onset   Heart disease Father        MI   Hypertension Father  Hypothyroidism Mother    Breast cancer  Mother    Diabetes Maternal Grandfather    Diabetes Paternal Grandmother    Colon cancer Neg Hx    Esophageal cancer Neg Hx    Rectal cancer Neg Hx    Stomach cancer Neg Hx    Allergies  Allergen Reactions   Oxycodone-Acetaminophen Nausea Only   Sildenafil      Pt stated, It gave me a headache   Current Outpatient Medications on File Prior to Visit  Medication Sig Dispense Refill   cyanocobalamin  (VITAMIN B12) 1000 MCG tablet Take 1,000 mcg by mouth daily. (Patient taking differently: Take 2,500 mcg by mouth daily.)     ezetimibe  (ZETIA ) 10 MG tablet TAKE 1 TABLET (10 MG TOTAL) BY MOUTH DAILY. 90 tablet 0   triamcinolone  cream (KENALOG ) 0.1 % Apply 1 Application topically 2 (two) times daily. 30 g 0   No current facility-administered medications on file prior to visit.    Review of Systems  Constitutional:  Negative for activity change, appetite change, fatigue, fever and unexpected weight change.  HENT:  Negative for congestion, rhinorrhea, sore throat and trouble swallowing.   Eyes:  Positive for visual disturbance. Negative for pain, redness and itching.  Respiratory:  Negative for cough, chest tightness, shortness of breath and wheezing.   Cardiovascular:  Negative for chest pain and palpitations.  Gastrointestinal:  Negative for abdominal pain, blood in stool, constipation, diarrhea and nausea.  Endocrine: Negative for cold intolerance, heat intolerance, polydipsia and polyuria.  Genitourinary:  Negative for difficulty urinating, dysuria, frequency and urgency.  Musculoskeletal:  Negative for arthralgias, joint swelling and myalgias.  Skin:  Negative for pallor and rash.  Neurological:  Negative for dizziness, tremors, weakness, numbness and headaches.  Hematological:  Negative for adenopathy. Does not bruise/bleed easily.  Psychiatric/Behavioral:  Negative for decreased concentration and dysphoric mood. The patient is not nervous/anxious.        Objective:   Physical  Exam Constitutional:      General: He is not in acute distress.    Appearance: Normal appearance. He is well-developed and normal weight. He is not ill-appearing or diaphoretic.  HENT:     Head: Normocephalic and atraumatic.     Right Ear: Tympanic membrane, ear canal and external ear normal.     Left Ear: Tympanic membrane, ear canal and external ear normal.     Nose: Nose normal. No congestion.     Mouth/Throat:     Mouth: Mucous membranes are moist.     Pharynx: Oropharynx is clear. No posterior oropharyngeal erythema.  Eyes:     General: No scleral icterus.       Right eye: No discharge.        Left eye: No discharge.     Conjunctiva/sclera: Conjunctivae normal.     Pupils: Pupils are equal, round, and reactive to light.  Neck:     Thyroid : No thyromegaly.     Vascular: No carotid bruit or JVD.  Cardiovascular:     Rate and Rhythm: Normal rate and regular rhythm.     Pulses: Normal pulses.     Heart sounds: Normal heart sounds.     No gallop.  Pulmonary:     Effort: Pulmonary effort is normal. No respiratory distress.     Breath sounds: Normal breath sounds. No wheezing or rales.     Comments: Good air exch Chest:     Chest wall: No tenderness.  Abdominal:     General: Bowel sounds are  normal. There is no distension or abdominal bruit.     Palpations: Abdomen is soft. There is no mass.     Tenderness: There is no abdominal tenderness.     Hernia: No hernia is present.  Musculoskeletal:        General: No tenderness.     Cervical back: Normal range of motion and neck supple. No rigidity. No muscular tenderness.     Right lower leg: No edema.     Left lower leg: No edema.  Lymphadenopathy:     Cervical: No cervical adenopathy.  Skin:    General: Skin is warm and dry.     Coloration: Skin is not pale.     Findings: No erythema or rash.     Comments: Solar lentigines diffusely   Neurological:     Mental Status: He is alert.     Cranial Nerves: No cranial nerve  deficit.     Motor: No abnormal muscle tone.     Coordination: Coordination normal.     Gait: Gait normal.     Deep Tendon Reflexes: Reflexes are normal and symmetric. Reflexes normal.  Psychiatric:        Mood and Affect: Mood normal.        Cognition and Memory: Cognition and memory normal.           Assessment & Plan:   Problem List Items Addressed This Visit       Endocrine   Subclinical hypothyroidism   Normal TSH Lab Results  Component Value Date   TSH 4.73 04/13/2024   Clinically stable        Other   Routine general medical examination at a health care facility - Primary   Reviewed health habits including diet and exercise and skin cancer prevention Reviewed appropriate screening tests for age  Also reviewed health mt list, fam hx and immunization status , as well as social and family history   See HPI Labs reviewed and ordered Health Maintenance  Topic Date Due   HIV Screening  Never done   Pneumococcal Vaccine for high risk medical condition (1 of 2 - PCV) Never done   Pneumococcal Vaccine for age over 25 (1 of 2 - PCV) Never done   Hepatitis B Vaccine (1 of 3 - 19+ 3-dose series) Never done   Zoster (Shingles) Vaccine (1 of 2) Never done   COVID-19 Vaccine (4 - 2024-25 season) 05/12/2023   Flu Shot  12/08/2024*   DTaP/Tdap/Td vaccine (3 - Td or Tdap) 03/26/2027   Colon Cancer Screening  08/30/2029   Hepatitis C Screening  Completed   HPV Vaccine  Aged Out   Meningitis B Vaccine  Aged Out  *Topic was postponed. The date shown is not the original due date.    Encouraged flu shot in fall  Will check on shingrix coverage Psa stable  Discussed fall prevention, supplements and exercise for bone density  PHQ 0  Encouraged use of sun protection       Prostate cancer screening   Lab Results  Component Value Date   PSA 0.78 04/13/2024   PSA 0.52 02/27/2023   PSA 0.28 07/25/2020    No urinary symptoms No fam history       Hyperlipidemia    Disc goals for lipids and reasons to control them Rev last labs with pt Rev low sat fat diet in detail  Intol of statin Taking zetia  10 mg daily  LDL down to 92 HDL also down- encouraged  exercise and healthy diet       History of retinal tear   Lifelong problems in left eye  Vision is 20/80 Also cataract surgery Has adjusted       Colon cancer screening   Colonoscopy 08/2019 with 10 y recall      B12 deficiency   Lab Results  Component Value Date   VITAMINB12 1,498 (H) 04/13/2024   Had shot in June Taking 2500 mcg daily   Will re check this in 2 months and see if it drifts down/ will likely be able to decrease dose       Relevant Orders   Vitamin B12

## 2024-04-17 NOTE — Patient Instructions (Addendum)
 If you are interested in the shingles vaccine series (Shingrix), call your insurance or pharmacy to check on coverage and location it must be given.  If affordable - you can schedule it here or at your pharmacy depending on coverage   Stay active  During slow periods with work - then add more strength training   Wear sun protection    For cholesterol Avoid red meat/ fried foods/ egg yolks/ fatty breakfast meats/ butter, cheese and high fat dairy/ and shellfish   Let's check B12 again in 2 months  Continue your current supplement

## 2024-04-19 DIAGNOSIS — Z8669 Personal history of other diseases of the nervous system and sense organs: Secondary | ICD-10-CM | POA: Insufficient documentation

## 2024-04-19 NOTE — Assessment & Plan Note (Signed)
 Lab Results  Component Value Date   VITAMINB12 1,498 (H) 04/13/2024   Had shot in June Taking 2500 mcg daily   Will re check this in 2 months and see if it drifts down/ will likely be able to decrease dose

## 2024-04-19 NOTE — Assessment & Plan Note (Signed)
 Normal TSH Lab Results  Component Value Date   TSH 4.73 04/13/2024   Clinically stable

## 2024-04-19 NOTE — Assessment & Plan Note (Signed)
 Lifelong problems in left eye  Vision is 20/80 Also cataract surgery Has adjusted

## 2024-04-19 NOTE — Assessment & Plan Note (Signed)
 Colonoscopy 08/2019 with 10 y recall

## 2024-04-19 NOTE — Assessment & Plan Note (Signed)
 Lab Results  Component Value Date   PSA 0.78 04/13/2024   PSA 0.52 02/27/2023   PSA 0.28 07/25/2020    No urinary symptoms No fam history

## 2024-04-19 NOTE — Assessment & Plan Note (Signed)
 Reviewed health habits including diet and exercise and skin cancer prevention Reviewed appropriate screening tests for age  Also reviewed health mt list, fam hx and immunization status , as well as social and family history   See HPI Labs reviewed and ordered Health Maintenance  Topic Date Due   HIV Screening  Never done   Pneumococcal Vaccine for high risk medical condition (1 of 2 - PCV) Never done   Pneumococcal Vaccine for age over 21 (1 of 2 - PCV) Never done   Hepatitis B Vaccine (1 of 3 - 19+ 3-dose series) Never done   Zoster (Shingles) Vaccine (1 of 2) Never done   COVID-19 Vaccine (4 - 2024-25 season) 05/12/2023   Flu Shot  12/08/2024*   DTaP/Tdap/Td vaccine (3 - Td or Tdap) 03/26/2027   Colon Cancer Screening  08/30/2029   Hepatitis C Screening  Completed   HPV Vaccine  Aged Out   Meningitis B Vaccine  Aged Out  *Topic was postponed. The date shown is not the original due date.    Encouraged flu shot in fall  Will check on shingrix coverage Psa stable  Discussed fall prevention, supplements and exercise for bone density  PHQ 0  Encouraged use of sun protection

## 2024-04-19 NOTE — Assessment & Plan Note (Signed)
 Disc goals for lipids and reasons to control them Rev last labs with pt Rev low sat fat diet in detail  Intol of statin Taking zetia  10 mg daily  LDL down to 92 HDL also down- encouraged exercise and healthy diet

## 2024-06-10 ENCOUNTER — Other Ambulatory Visit (INDEPENDENT_AMBULATORY_CARE_PROVIDER_SITE_OTHER)

## 2024-06-10 DIAGNOSIS — E538 Deficiency of other specified B group vitamins: Secondary | ICD-10-CM

## 2024-06-11 ENCOUNTER — Ambulatory Visit: Payer: Self-pay | Admitting: Family Medicine

## 2024-06-11 LAB — VITAMIN B12: Vitamin B-12: >1500 pg/mL — ABNORMAL HIGH (ref 211–911)

## 2024-06-17 ENCOUNTER — Other Ambulatory Visit

## 2024-07-18 ENCOUNTER — Other Ambulatory Visit: Payer: Self-pay | Admitting: Family Medicine

## 2024-07-20 LAB — OPHTHALMOLOGY REPORT-SCANNED

## 2024-08-24 ENCOUNTER — Ambulatory Visit: Payer: Self-pay

## 2024-08-24 NOTE — Telephone Encounter (Signed)
 No one has any appts today, please advise

## 2024-08-24 NOTE — Telephone Encounter (Signed)
 FYI Only or Action Required?: Action required by provider: request for appointment.  Patient was last seen in primary care on 04/17/2024 by Randeen Laine LABOR, MD.  Called Nurse Triage reporting Seizures.  Symptoms began several days ago.  Interventions attempted: OTC medications:   and Other: [place]  Symptoms are: stable.Seizure x 1 last Thursday. Was [place] the country and was seen in ED. Had imaging of head per wife. Asking to be worked in today.  Triage Disposition: See HCP Within 4 Hours (Or PCP Triage)  Patient/caregiver understands and will follow disposition?: Yes    Copied from CRM [id number]. Topic: Clinical - Red Word Triage >> Aug 24, 2024  8:38 AM Warren Hatfield wrote: Red Word that prompted transfer to Nurse Triage: Out of the country over the weekend and had a seizure in his sleep. Patient does not remember anything. Reason for Disposition  Not on or ran out of antiseizure medicine (anticonvulsant)  Answer Assessment - Initial Assessment Questions 1. ONSET: When did the seizure occur?     Weekend 2. DURATION: How long did the seizure last (or how long has it been happening)? (e.g., seconds, minutes)  Note: Most seizures last less than 5 minutes.     2 minutes 3. DESCRIPTION: Describe what happened during the seizure. Did the body become stiff? Was there any jerking?  Did they lose consciousness during the seizure?     Shaking  4. CIRCUMSTANCE: What was the person doing when the seizure began?      sleeping 5. MENTAL STATUS AFTER SEIZURE: Does the person seem more groggy or sleepy? Does the person know who they are, who you are, and where they are now?      yes 6. PRIOR SEIZURES: Has the person had a seizure (convulsion) before? (e.g., epilepsy, other cause)  If Yes, ask: When was the last time? and What happened last time?      no 7. EPILEPSY: Does the person have epilepsy? Note: Check for medical ID bracelet.     no 8.  MEDICINES: Does the person take anticonvulsant medications? (e.g., Yes, No; missed doses, any recent changes)     no 9. INJURY: Was the person hurt or injured during the seizure? (e.g., hit their head, bit their tongue)     no 10. OTHER SYMPTOMS: Are there any other symptoms? (e.g., fever, headache)       no 11. PREGNANCY: Is there any chance you are pregnant? When was your last menstrual period?       N/a  Protocols used: [facility]

## 2024-08-24 NOTE — Telephone Encounter (Signed)
 Called pt and gave him the options of seeing another provider tomorrow or Dr. Randeen on Wednesday. Pt wants to wait and see PCP Wednesday. ER precautions given. Pt said his 1st seizure was on Thursday 08/20/24. Pt is back home yesterday and feels okay, he did bite his tongue so it's still sore but other then that pt said he feels back to himself. They did tell him he also had a UTI so they gave him a IV bag of rocephin before he was d/c. He did provide phone # of hospital: [facility] in [place] however I couldn't call due to it being outside of [place]  (phone wouldn't place call) I did go on their website and send a email requesting them to email us  his records if possible however if we do not get his records pt does have a DVD of his CT and MRI he had done.

## 2024-08-24 NOTE — Telephone Encounter (Signed)
 What open slots do we have for anyone this week?   What day was the one seizure?  How is he feeling now ?  When did he get home ?  Thanks

## 2024-08-24 NOTE — Telephone Encounter (Signed)
 Will see patient then Agree with ER and UC precautions  Thanks for trying to get records

## 2024-08-26 ENCOUNTER — Ambulatory Visit: Admitting: Family Medicine

## 2024-08-26 ENCOUNTER — Encounter: Payer: Self-pay | Admitting: Family Medicine

## 2024-08-26 ENCOUNTER — Ambulatory Visit: Payer: Self-pay | Admitting: Family Medicine

## 2024-08-26 VITALS — BP 138/82 | HR 77 | Temp 98.5°F | Ht 66.5 in | Wt 172.0 lb

## 2024-08-26 DIAGNOSIS — R569 Unspecified convulsions: Secondary | ICD-10-CM | POA: Insufficient documentation

## 2024-08-26 DIAGNOSIS — E538 Deficiency of other specified B group vitamins: Secondary | ICD-10-CM

## 2024-08-26 DIAGNOSIS — R0681 Apnea, not elsewhere classified: Secondary | ICD-10-CM | POA: Insufficient documentation

## 2024-08-26 LAB — CBC WITH DIFFERENTIAL/PLATELET
Basophils Absolute: 0 K/uL (ref 0.0–0.1)
Basophils Relative: 0.6 % (ref 0.0–3.0)
Eosinophils Absolute: 0.1 K/uL (ref 0.0–0.7)
Eosinophils Relative: 1.4 % (ref 0.0–5.0)
HCT: 41.3 % (ref 39.0–52.0)
Hemoglobin: 14.2 g/dL (ref 13.0–17.0)
Lymphocytes Relative: 24.6 % (ref 12.0–46.0)
Lymphs Abs: 1.6 K/uL (ref 0.7–4.0)
MCHC: 34.3 g/dL (ref 30.0–36.0)
MCV: 89.6 fl (ref 78.0–100.0)
Monocytes Absolute: 0.4 K/uL (ref 0.1–1.0)
Monocytes Relative: 5.7 % (ref 3.0–12.0)
Neutro Abs: 4.3 K/uL (ref 1.4–7.7)
Neutrophils Relative %: 67.7 % (ref 43.0–77.0)
Platelets: 179 K/uL (ref 150.0–400.0)
RBC: 4.61 Mil/uL (ref 4.22–5.81)
RDW: 12.9 % (ref 11.5–15.5)
WBC: 6.4 K/uL (ref 4.0–10.5)

## 2024-08-26 LAB — HEPATIC FUNCTION PANEL
ALT: 42 U/L (ref 3–53)
AST: 62 U/L — ABNORMAL HIGH (ref 5–37)
Albumin: 4.4 g/dL (ref 3.5–5.2)
Alkaline Phosphatase: 66 U/L (ref 39–117)
Bilirubin, Direct: 0.1 mg/dL (ref 0.1–0.3)
Total Bilirubin: 0.5 mg/dL (ref 0.2–1.2)
Total Protein: 6.8 g/dL (ref 6.0–8.3)

## 2024-08-26 LAB — BASIC METABOLIC PANEL WITH GFR
BUN: 20 mg/dL (ref 6–23)
CO2: 27 meq/L (ref 19–32)
Calcium: 9.2 mg/dL (ref 8.4–10.5)
Chloride: 103 meq/L (ref 96–112)
Creatinine, Ser: 1 mg/dL (ref 0.40–1.50)
GFR: 84.01 mL/min (ref >60.00)
Glucose, Bld: 96 mg/dL (ref 70–99)
Potassium: 4.1 meq/L (ref 3.5–5.1)
Sodium: 138 meq/L (ref 135–145)

## 2024-08-26 LAB — VITAMIN B12: Vitamin B-12: 743 pg/mL (ref 211–911)

## 2024-08-26 LAB — TSH: TSH: 4.15 u[IU]/mL (ref 0.35–5.50)

## 2024-08-26 LAB — MAGNESIUM: Magnesium: 2 mg/dL (ref 1.5–2.5)

## 2024-08-26 NOTE — Assessment & Plan Note (Signed)
 Added to lab today

## 2024-08-26 NOTE — Assessment & Plan Note (Addendum)
 First seizure  Occurred while out of country in middle of the night  lasting total 3-5 minutes  Per report tonic/clonic most likely, did bite tongue and did not loose bowel or bladder control  Did have post ictal state  Was seen in ER (in [place]) - limited info available, noted normal MRI and CT head and normal CT chest ,  was treated for possible uti with rocephin (but no labs available)  Unsure of tox screen, pt had etoh several days prior/not that day and denies recreational drug use  Per pt-thinks EKG and labs were otherwise ok  Pt will email imaging reports to us   Was not started on seizure medication No episodes since   There is question of sleep apnea /likely- could play role   Will urgently refer to neurology  Advised not to drive or swim or operate equipment   Reassuring exam today Feels back to baseline   Family will continue to monitor very closely  Lab today

## 2024-08-26 NOTE — Progress Notes (Signed)
 Subjective:    Patient ID: Warren Hatfield, male    DOB: [birth year], 58 y.o.   MRN: [id number]  HPI  Wt Readings from Last 3 Encounters:  08/26/24 172 lb (78 kg)  04/17/24 172 lb 8 oz (78.2 kg)  03/06/24 178 lb 4 oz (80.9 kg)   27.35 kg/m  Vitals:   08/26/24 1205 08/26/24 1256  BP: (!) 150/84 138/82  Pulse: 77   Temp: 98.5 F (36.9 C)   SpO2: 96%    Here for follow up of  A seizure   In [place] with friends   This was day 2 of the trip   The night prior- smelled something in the room / he hunted around the room for an engineer, technical sales  (flowery/fruity and sickening)  Came and went  Occasional  No one else smelled it   Few times noticed arms and fingers tingled and became numb  No trigger  Lasting less than a minute at a time   Malaise-did not feel well in general  Nodding off easily / tired (not like himself)   Night before seizure  Alcohol 1-2 fruity drinks with dinner , after dinner some mixed drinks and shots  Never felt drunk- thought drinks were likely pretty light   Does not sleep well baseline Always sleep deprived   No head trauma at all  No head aches   Day of seizure- was tired /did not do much  Came back from dinner  Decided to stay in room   Fell asleep  About 20 minutes into sleep Some snoring Then made a loud sound/ went rigid , eyes flared open and gritted teeth  Not a lot of convulsing  Wife pushed on him /tried to wake him (not responsive)  She tried to open mouth and do chest compressions  Foaming at mouth-bit his tongue  Pale in face but not blue  3-5 minutes- started to react to stimuli  Came around 5-7 minutes total  Then felt out of it /was not alert or oriented     Went to ER Imaging  MRI brain-no intercranial abn Some sinus mucousal dz right frontal  CT brain-normal  CT chest-normal  Treated with rocephin for uti   Wbc high  Blood pressure high It came down  Blood culture /panel ? Tox screen   Does  snore Per partner-does stop breathing sometimes Has a mouth device for snoring   Of note-had spider bite working under house  On leg Healed       Patient Active Problem List   Diagnosis Date Noted   Seizure (HCC) 08/26/2024   Apnea 08/26/2024   History of retinal tear 04/19/2024   Sting from hornet, wasp, or bee 03/06/2024   History of exposure to measles 03/06/2024   Family history of early CAD 12/13/2020   Hyperlipidemia 06/13/2020   Abnormal EKG 06/13/2020   Colon cancer screening 04/30/2018   Erectile dysfunction 03/25/2017   Subclinical hypothyroidism 03/25/2017   Prostate cancer screening 03/17/2017   Eczema 11/18/2015   Routine general medical examination at a health care facility 12/22/2010   B12 deficiency 11/16/2009   Past Medical History:  Diagnosis Date   Abnormal EKG    B12 deficiency    Eczema    ED (erectile dysfunction)    Elevated TSH    Hip pain    Hyperlipidemia    Seizures (HCC)    Stress reaction    Subclinical hypothyroidism    Past Surgical History:  Procedure Laterality Date   EYE SURGERY  1993   ALK both eyes   FRACTURE SURGERY  1998   Right hand   HAND SURGERY  1998   plate nd screws in 3rd finger of right hand following fx   JOINT REPLACEMENT  2012   Right hip   LASIK  1993   Both eyes   TOTAL HIP ARTHROPLASTY  10/2009   VASECTOMY  10/2006   Social History[1] Family History  Problem Relation Age of Onset   Heart disease Father        MI   Hypertension Father    Early death Father    Hypothyroidism Mother    Breast cancer Mother    Cancer Mother    Diabetes Maternal Grandfather    Diabetes Paternal Grandmother    Colon cancer Neg Hx    Esophageal cancer Neg Hx    Rectal cancer Neg Hx    Stomach cancer Neg Hx    Allergies[2] Medications Ordered Prior to Encounter[3]  Review of Systems  Constitutional:  Negative for activity change, appetite change, fatigue, fever and unexpected weight change.  HENT:  Negative for  congestion, rhinorrhea, sore throat and trouble swallowing.        Tongue is healing after injury /bite   Eyes:  Negative for pain, redness, itching and visual disturbance.  Respiratory:  Negative for cough, chest tightness, shortness of breath and wheezing.   Cardiovascular:  Negative for chest pain and palpitations.  Gastrointestinal:  Negative for abdominal pain, blood in stool, constipation, diarrhea and nausea.  Endocrine: Negative for cold intolerance, heat intolerance, polydipsia and polyuria.  Genitourinary:  Negative for difficulty urinating, dysuria, frequency and urgency.  Musculoskeletal:  Negative for arthralgias, joint swelling and myalgias.       Leg muscles were sore after seizure episode   Skin:  Negative for pallor and rash.  Neurological:  Negative for dizziness, tremors, syncope, facial asymmetry, speech difficulty, weakness, light-headedness, numbness and headaches.  Hematological:  Negative for adenopathy. Does not bruise/bleed easily.  Psychiatric/Behavioral:  Negative for decreased concentration and dysphoric mood. The patient is not nervous/anxious.        Objective:   Physical Exam Constitutional:      General: He is not in acute distress.    Appearance: Normal appearance. He is well-developed and normal weight. He is not ill-appearing or diaphoretic.  HENT:     Head: Normocephalic and atraumatic.     Right Ear: Tympanic membrane, ear canal and external ear normal.     Left Ear: Tympanic membrane, ear canal and external ear normal.     Nose: Nose normal. No congestion or rhinorrhea.     Mouth/Throat:     Mouth: Mucous membranes are moist.     Pharynx: Oropharynx is clear. No oropharyngeal exudate.     Comments: Some bite marks on sides of tongue -appear to be healing  Eyes:     General: No scleral icterus.       Right eye: No discharge.        Left eye: No discharge.     Conjunctiva/sclera: Conjunctivae normal.     Pupils: Pupils are equal, round, and  reactive to light.     Comments: No nystagmus  Neck:     Thyroid : No thyromegaly.     Vascular: No carotid bruit or JVD.     Trachea: No tracheal deviation.  Cardiovascular:     Rate and Rhythm: Normal rate and regular rhythm.  Heart sounds: Normal heart sounds. No murmur heard. Pulmonary:     Effort: Pulmonary effort is normal. No respiratory distress.     Breath sounds: Normal breath sounds. No stridor. No wheezing, rhonchi or rales.  Chest:     Chest wall: No tenderness.  Abdominal:     General: Bowel sounds are normal. There is no distension.     Palpations: Abdomen is soft. There is no mass.     Tenderness: There is no abdominal tenderness.  Musculoskeletal:        General: No tenderness.     Cervical back: Full passive range of motion without pain, normal range of motion and neck supple. No rigidity or tenderness.  Lymphadenopathy:     Cervical: No cervical adenopathy.  Skin:    General: Skin is warm and dry.     Coloration: Skin is not jaundiced or pale.     Findings: No bruising, erythema or rash.  Neurological:     Mental Status: He is alert and oriented to person, place, and time.     Cranial Nerves: No cranial nerve deficit, dysarthria or facial asymmetry.     Sensory: Sensation is intact. No sensory deficit.     Motor: No weakness, tremor, atrophy, abnormal muscle tone, seizure activity or pronator drift.     Coordination: Romberg sign negative. Coordination normal. Finger-Nose-Finger Test normal.     Gait: Gait and tandem walk normal.     Deep Tendon Reflexes: Reflexes are normal and symmetric. Reflexes normal.     Comments: No focal cerebellar signs   Psychiatric:        Behavior: Behavior normal.        Thought Content: Thought content normal.           Assessment & Plan:   Problem List Items Addressed This Visit       Other   Seizure (HCC) - Primary   First seizure  Occurred while out of country in middle of the night  lasting total 3-5 minutes   Per report tonic/clonic most likely, did bite tongue and did not loose bowel or bladder control  Did have post ictal state  Was seen in ER (in [place]) - limited info available, noted normal MRI and CT head and normal CT chest ,  was treated for possible uti with rocephin (but no labs available)  Unsure of tox screen, pt had etoh several days prior/not that day and denies recreational drug use  Per pt-thinks EKG and labs were otherwise ok  Pt will email imaging reports to us   Was not started on seizure medication No episodes since   There is question of sleep apnea /likely- could play role   Will urgently refer to neurology  Advised not to drive or swim or operate equipment   Reassuring exam today Feels back to baseline   Family will continue to monitor very closely  Lab today        Relevant Orders   Basic metabolic panel with GFR   Hepatic function panel   CBC with Differential/Platelet   TSH   Vitamin B12   Magnesium   B12 deficiency   Added to lab today      Relevant Orders   Vitamin B12   Apnea   Witness apnea at night (spouse) with snoring Likely sleep apnea          [1]  Social History Tobacco Use   Smoking status: Former    Types: Cigars, Cigarettes  Smokeless tobacco: Never  Vaping Use   Vaping status: Never Used  Substance Use Topics   Alcohol use: Yes    Alcohol/week: 2.0 standard drinks of alcohol    Types: 2 Standard drinks or equivalent per week    Comment: 1-2 drinks per month maximum   Drug use: Never  [2]  Allergies Allergen Reactions   Oxycodone-Acetaminophen Nausea Only   Sildenafil      Pt stated, It gave me a headache  [3]  Current Outpatient Medications on File Prior to Visit  Medication Sig Dispense Refill   cyanocobalamin  (VITAMIN B12) 1000 MCG tablet Take 1,000 mcg by mouth daily.     ezetimibe  (ZETIA ) 10 MG tablet TAKE 1 TABLET (10 MG TOTAL) BY MOUTH DAILY. 90 tablet 1   Magnesium Glycinate 100 MG CAPS Take 1 tablet by  mouth daily.     triamcinolone  cream (KENALOG ) 0.1 % Apply 1 Application topically 2 (two) times daily. 30 g 0   No current facility-administered medications on file prior to visit.

## 2024-08-26 NOTE — Assessment & Plan Note (Signed)
 Witness apnea at night (spouse) with snoring Likely sleep apnea

## 2024-08-26 NOTE — Patient Instructions (Addendum)
 Get on mychart  Email me any attachments from your hospital visit    For now No swimming  No driving   Avoid alcohol, drugs, smoking   I put the referral in for neurology (urgent)  Please let us  know if you don't hear in 1-2 weeks to set that up (mychart message or call or letter)

## 2024-08-28 ENCOUNTER — Encounter: Payer: Self-pay | Admitting: Neurology

## 2024-08-28 ENCOUNTER — Ambulatory Visit: Admitting: Neurology

## 2024-08-28 VITALS — BP 165/88 | HR 72 | Ht 66.0 in | Wt 179.6 lb

## 2024-08-28 DIAGNOSIS — R569 Unspecified convulsions: Secondary | ICD-10-CM

## 2024-08-28 NOTE — Patient Instructions (Signed)
 Good to meet you.  Schedule 1-hour EEG. If normal, we will do a 24-hour EEG  2. Try to get a copy of the sleep study report to add to our records  3. Follow-up in 3 months or earlier if needed, call for any changes   Seizure Precautions: 1. If medication has been prescribed for you to prevent seizures, take it exactly as directed.  Do not stop taking the medicine without talking to your doctor first, even if you have not had a seizure in a long time.   2. Avoid activities in which a seizure would cause danger to yourself or to others.  Don't operate dangerous machinery, swim alone, or climb in high or dangerous places, such as on ladders, roofs, or girders.  Do not drive unless your doctor says you may.  3. If you have any warning that you may have a seizure, lay down in a safe place where you can't hurt yourself.    4.  No driving for 6 months from last seizure, as per [place]  state law.   Please refer to the following link on the Epilepsy Foundation of America's website for more information: http://www.epilepsyfoundation.org/answerplace/Social/driving/drivingu.cfm   5.  Maintain good sleep hygiene. Avoid alcohol.  6.  Contact your doctor if you have any problems that may be related to the medicine you are taking.  7.  Call 911 and bring the patient back to the ED if:        A.  The seizure lasts longer than 5 minutes.       B.  The patient doesn't awaken shortly after the seizure  C.  The patient has new problems such as difficulty seeing, speaking or moving  D.  The patient was injured during the seizure  E.  The patient has a temperature over 102 F (39C)  F.  The patient vomited and now is having trouble breathing
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
## 2024-08-31 ENCOUNTER — Ambulatory Visit: Admitting: Neurology

## 2024-08-31 DIAGNOSIS — R569 Unspecified convulsions: Secondary | ICD-10-CM

## 2024-08-31 NOTE — Progress Notes (Unsigned)
 EEG complete and ready for review.

## 2024-09-01 NOTE — Procedures (Signed)
 ELECTROENCEPHALOGRAM REPORT  Date of Study: 08/31/2024  Patient's Name: Warren Hatfield MRN: [id number] Date of Birth: 1965/10/23  Referring Provider: Dr. Darice Shivers  Clinical History: This is a 58 year old man with new onset seizure, olfactory hallucinations. EEG for classification.  Medications: Zetia , vitamin B12  Technical Summary: A multichannel digital 1-hour EEG recording measured by the international 10-20 system with electrodes applied with paste and impedances below 5000 ohms performed in our laboratory with EKG monitoring in an awake and asleep patient.  Hyperventilation and photic stimulation were performed.  The digital EEG was referentially recorded, reformatted, and digitally filtered in a variety of bipolar and referential montages for optimal display.    Description: The patient is awake and asleep during the recording.  During maximal wakefulness, there is a symmetric, medium voltage 9 Hz posterior dominant rhythm that attenuates with eye opening.  The record is symmetric.  During drowsiness and sleep, there is an increase in theta slowing of the background.  Vertex waves and symmetric sleep spindles were seen. Hyperventilation and photic stimulation did not elicit any abnormalities.  There were no epileptiform discharges or electrographic seizures seen.    EKG lead was unremarkable.  Impression: This 1-hour awake and asleep EEG is normal.    Clinical Correlation: A normal EEG does not exclude a clinical diagnosis of epilepsy.  If further clinical questions remain, prolonged EEG may be helpful.  Clinical correlation is advised.   Darice Shivers, M.D.

## 2024-09-08 ENCOUNTER — Ambulatory Visit: Payer: Self-pay | Admitting: Neurology

## 2024-09-08 DIAGNOSIS — R569 Unspecified convulsions: Secondary | ICD-10-CM

## 2024-09-08 NOTE — Telephone Encounter (Signed)
-----   Message from Darice Shivers, MD sent at 09/08/2024 11:42 AM EST ----- Pls let him know the EEG is normal. We talked about doing a 24-hour EEG if the office EEG is normal. Not sure if [facility] still has openings, if not, pls send to [facility], thanks

## 2024-09-08 NOTE — Telephone Encounter (Signed)
 Pt called an informed that EEG is normal. We talked about doing a 24-hour EEG if the office EEG is normal, order has been placed

## 2024-09-23 ENCOUNTER — Ambulatory Visit: Payer: Self-pay | Admitting: Neurology

## 2024-09-23 DIAGNOSIS — R569 Unspecified convulsions: Secondary | ICD-10-CM

## 2024-09-23 NOTE — Progress Notes (Signed)
 Ambulatory EEG hooked up and running. Light flashing. No push button due to previous appts returned broken - advised pt to write down events, time, and date. Camera and event log explained. Batteries explained. Patient understood.

## 2024-09-24 NOTE — Progress Notes (Signed)
 AMB EEG discontinued.  Skin Breakdown:no Diary Returned: Yes no events noted.

## 2024-09-30 ENCOUNTER — Telehealth: Payer: Self-pay | Admitting: Neurology

## 2024-09-30 NOTE — Telephone Encounter (Signed)
 [facility] Call ID: [phone]  Caller states he is needing test results.

## 2024-09-30 NOTE — Telephone Encounter (Signed)
 Pt is calling for his amb EEG results,

## 2024-10-01 NOTE — Procedures (Signed)
 ELECTROENCEPHALOGRAM REPORT  Dates of Recording: 09/23/2024 10:30AM to 09/24/2024 11:36AM  Patient's Name: Warren Hatfield MRN: [id number] Date of Birth: 1966-07-30  Referring Provider: Dr. Darice Shivers  Procedure: 25-hour ambulatory video EEG  History: This is a 59 year old man with new onset seizure, olfactory hallucinations. EEG for classification.   Medications: Zetia , vitamin B12  Technical Summary: This is a 25-hour multichannel digital video EEG recording measured by the international 10-20 system with electrodes applied with paste and impedances below 5000 ohms performed as portable with EKG monitoring.  The digital EEG was referentially recorded, reformatted, and digitally filtered in a variety of bipolar and referential montages for optimal display.    DESCRIPTION OF RECORDING: During maximal wakefulness, the background activity consisted of a symmetric 9 Hz posterior dominant rhythm which was reactive to eye opening.  There were no epileptiform discharges or focal slowing seen in wakefulness.  During the recording, the patient progresses through wakefulness, drowsiness, and Stage 2 sleep.  Again, there were no epileptiform discharges seen.  Events: There were no push button events. No symptoms reported on diary.  There were no electrographic seizures seen.  Technical difficulties with EKG lead.  IMPRESSION: This 25-hour ambulatory video EEG study is normal.    CLINICAL CORRELATION: A normal EEG does not exclude a clinical diagnosis of epilepsy.  Typical events were not captured. If further clinical questions remain, inpatient video EEG monitoring may be helpful.   Darice Shivers, M.D.

## 2024-10-01 NOTE — Telephone Encounter (Signed)
 Pls let him know the 24-hour EEG is normal. How has he been doing? Has he had any recurrence of symptoms, such as smelling the odd scent like before? Thanks

## 2024-10-01 NOTE — Telephone Encounter (Signed)
 Pt called an informed that 24-hour EEG is normal. How has he been doing? He has been doing fine  Has he had any recurrence of symptoms, such as smelling the odd scent like before?  He has not been smelling any odd scents

## 2024-10-06 NOTE — Telephone Encounter (Signed)
 F/u as scheduled, call for any change in symptoms

## 2024-11-11 ENCOUNTER — Ambulatory Visit: Payer: Self-pay | Admitting: Neurology
